# Patient Record
Sex: Female | Born: 1950 | Race: Black or African American | Hispanic: No | Marital: Married | Smoking: Former smoker
Health system: Southern US, Community
[De-identification: ages and names within clinical notes are randomized; demographics above are authoritative.]

## PROBLEM LIST (undated history)

## (undated) DIAGNOSIS — Z9289 Personal history of other medical treatment: Secondary | ICD-10-CM

## (undated) DIAGNOSIS — C189 Malignant neoplasm of colon, unspecified: Secondary | ICD-10-CM

## (undated) DIAGNOSIS — R51 Headache: Secondary | ICD-10-CM

## (undated) DIAGNOSIS — R531 Weakness: Secondary | ICD-10-CM

## (undated) DIAGNOSIS — R519 Headache, unspecified: Secondary | ICD-10-CM

## (undated) DIAGNOSIS — I639 Cerebral infarction, unspecified: Secondary | ICD-10-CM

## (undated) DIAGNOSIS — D649 Anemia, unspecified: Secondary | ICD-10-CM

## (undated) DIAGNOSIS — I10 Essential (primary) hypertension: Secondary | ICD-10-CM

## (undated) DIAGNOSIS — E785 Hyperlipidemia, unspecified: Secondary | ICD-10-CM

## (undated) DIAGNOSIS — E119 Type 2 diabetes mellitus without complications: Secondary | ICD-10-CM

## (undated) DIAGNOSIS — M1990 Unspecified osteoarthritis, unspecified site: Secondary | ICD-10-CM

## (undated) HISTORY — PX: BREAST LUMPECTOMY: SHX2

## (undated) HISTORY — PX: COLECTOMY: SHX59

## (undated) HISTORY — PX: CATARACT EXTRACTION W/ INTRAOCULAR LENS  IMPLANT, BILATERAL: SHX1307

## (undated) HISTORY — PX: ABDOMINAL HYSTERECTOMY: SHX81

---

## 1997-05-21 ENCOUNTER — Other Ambulatory Visit: Admission: RE | Admit: 1997-05-21 | Discharge: 1997-05-21 | Payer: Self-pay

## 1997-05-21 ENCOUNTER — Ambulatory Visit (HOSPITAL_COMMUNITY): Admission: RE | Admit: 1997-05-21 | Discharge: 1997-05-21 | Payer: Self-pay | Admitting: Internal Medicine

## 1997-05-22 ENCOUNTER — Other Ambulatory Visit: Admission: RE | Admit: 1997-05-22 | Discharge: 1997-05-22 | Payer: Self-pay | Admitting: Internal Medicine

## 1997-08-01 ENCOUNTER — Ambulatory Visit: Admission: RE | Admit: 1997-08-01 | Discharge: 1997-08-01 | Payer: Self-pay | Admitting: Internal Medicine

## 1998-11-28 ENCOUNTER — Other Ambulatory Visit: Admission: RE | Admit: 1998-11-28 | Discharge: 1998-11-28 | Payer: Self-pay | Admitting: Internal Medicine

## 1998-12-09 ENCOUNTER — Ambulatory Visit (HOSPITAL_COMMUNITY): Admission: RE | Admit: 1998-12-09 | Discharge: 1998-12-09 | Payer: Self-pay | Admitting: Internal Medicine

## 1998-12-09 ENCOUNTER — Encounter: Payer: Self-pay | Admitting: Internal Medicine

## 1999-11-04 ENCOUNTER — Encounter: Payer: Self-pay | Admitting: *Deleted

## 1999-11-04 ENCOUNTER — Observation Stay (HOSPITAL_COMMUNITY): Admission: EM | Admit: 1999-11-04 | Discharge: 1999-11-05 | Payer: Self-pay | Admitting: Emergency Medicine

## 1999-11-05 ENCOUNTER — Encounter: Payer: Self-pay | Admitting: Internal Medicine

## 2002-10-09 ENCOUNTER — Other Ambulatory Visit: Admission: RE | Admit: 2002-10-09 | Discharge: 2002-10-09 | Payer: Self-pay | Admitting: Internal Medicine

## 2002-10-11 ENCOUNTER — Encounter: Payer: Self-pay | Admitting: Internal Medicine

## 2002-10-11 ENCOUNTER — Encounter: Admission: RE | Admit: 2002-10-11 | Discharge: 2002-10-11 | Payer: Self-pay | Admitting: Internal Medicine

## 2002-10-30 ENCOUNTER — Encounter: Payer: Self-pay | Admitting: Internal Medicine

## 2002-10-30 ENCOUNTER — Encounter: Admission: RE | Admit: 2002-10-30 | Discharge: 2002-10-30 | Payer: Self-pay | Admitting: Internal Medicine

## 2003-02-14 ENCOUNTER — Emergency Department (HOSPITAL_COMMUNITY): Admission: AD | Admit: 2003-02-14 | Discharge: 2003-02-14 | Payer: Self-pay | Admitting: Family Medicine

## 2003-08-10 ENCOUNTER — Ambulatory Visit (HOSPITAL_COMMUNITY): Admission: RE | Admit: 2003-08-10 | Discharge: 2003-08-10 | Payer: Self-pay | Admitting: Gastroenterology

## 2003-08-21 ENCOUNTER — Ambulatory Visit (HOSPITAL_COMMUNITY): Admission: RE | Admit: 2003-08-21 | Discharge: 2003-08-21 | Payer: Self-pay | Admitting: Gastroenterology

## 2005-05-19 IMAGING — CT CT ABDOMEN W/ CM
1 of 4 series · 13 of 32 positions shown, 18 images · IV contrast (omnipaque)
Comparison: none

CLINICAL DATA: Colon ca; anemia
 CT ABDOMEN WITH CONTRAST
 Multidetector helical scans through the abdomen were performed after oral and IV contrast media were given.  150 cc of Omnipaque 300 were given as the contrast media.  
 Mild pleural thickening is noted posteriorly at the right lung base.  There is some inhomogeneity to the enhancement of the dome of the liver, suspicious for metastatic involvement.  As seen on image #12, there is a faint, peripherally enhancing lesion in the anterolateral right lobe of liver with a questionable subtle lesion in the posterior right lobe at that level as well.  If further confirmation is necessary, PET scan of the liver may be warranted.  There is also question of a subtle low attenuation lesion in the very dome of the right lobe of liver on image #7.  No ductal dilatation is seen.  No calcified gallstones are noted.  The pancreas is normal in size and no pancreatic ductal dilatation is seen.  The adrenal glands appear normal as does the spleen.  The kidneys enhance normally and on delayed images, the pelvocaliceal systems appear normal.  There is a rounded low attenuation structure in the posteromedial right mid kidney most consistent with benign process such as parapelvic cyst.  No adenopathy is seen.  The abdominal aorta is normal in caliber.  The appendix appears normal.  
 IMPRESSION
 1.  Inhomogeneity particularly to the dome of the right lobe of liver suspicious for subtle metastatic involvement.  Consider further evaluation as with PET scan of the liver.  
 2.  No adenopathy.  
 3.  Small hernia to the right of midline in the right lower abdomen. 
 CT OF THE PELVIS WITH CONTRAST
 Scans were continued through the pelvis after oral and IV contrast media were given.  The uterus is enlarged with areas of enhancement and diminished attenuation consistent with multiple uterine fibroids.  The urinary bladder is decompressed and unremarkable.  No pelvic adenopathy is seen.  There are significant degenerative changes involving the facet joints, particularly of L5-S1. 
 1.  Enlarged lobular uterus with inhomogeneous enhancement most consistent with multiple uterine fibroids. 
 2.  No pelvic adenopathy.
 3.  Degenerative change in the facet joints of L5-S1.

[Series 3: — · axial · 0.76mm/px · z∈[-479,-84]mm · 13 of 91 slices shown, 18 images]
[im 6/91  soft-tissue]
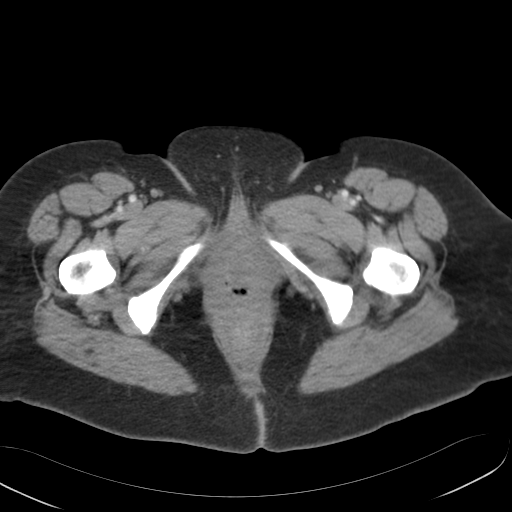
[im 6/91  bone]
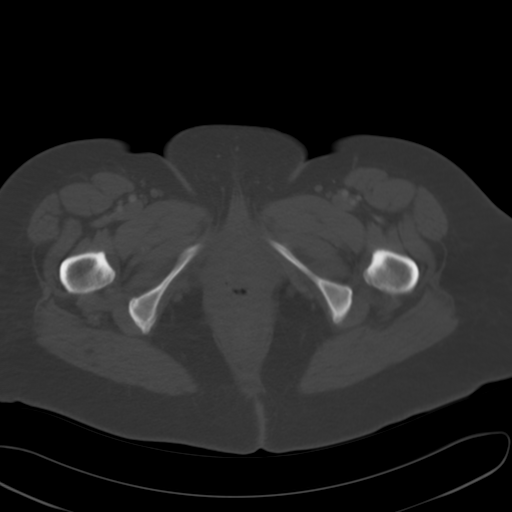
[im 12/91  soft-tissue]
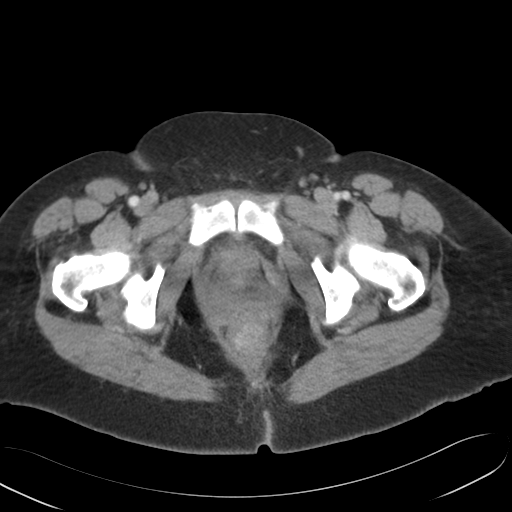
[im 23/91  soft-tissue]
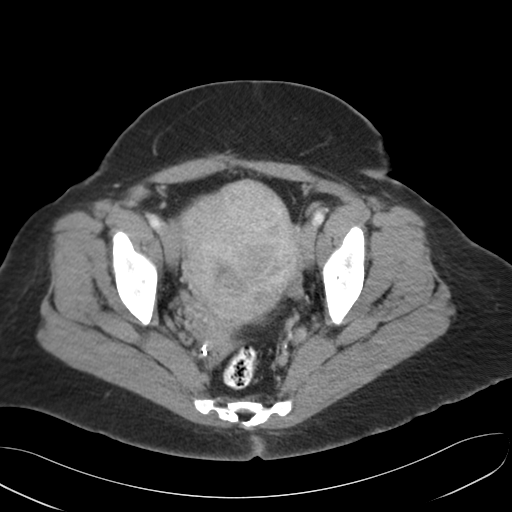
[im 29/91  soft-tissue]
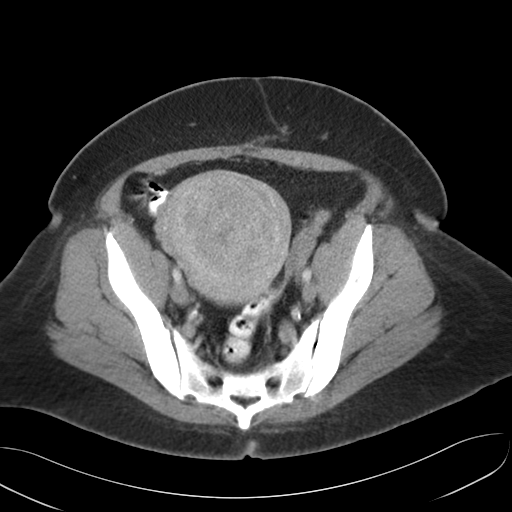
[im 34/91  soft-tissue]
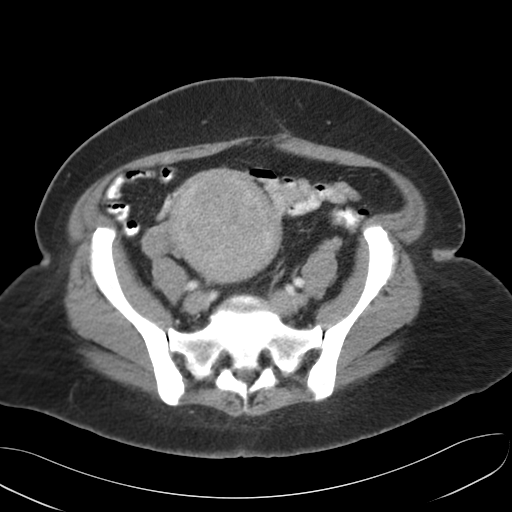
[im 40/91  soft-tissue]
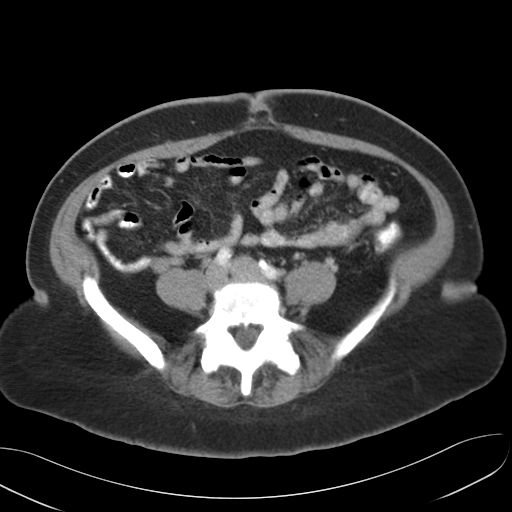
[im 51/91  soft-tissue]
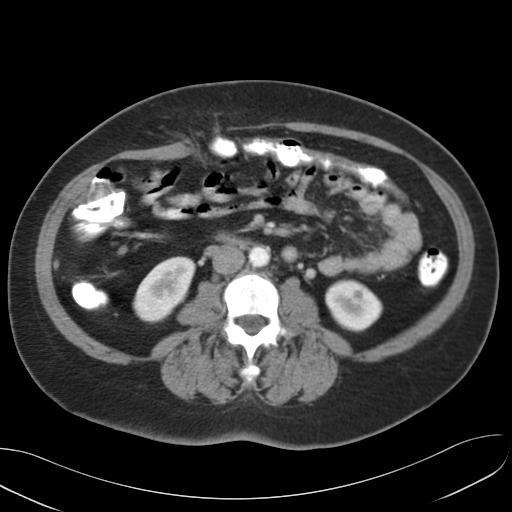
[im 57/91  soft-tissue]
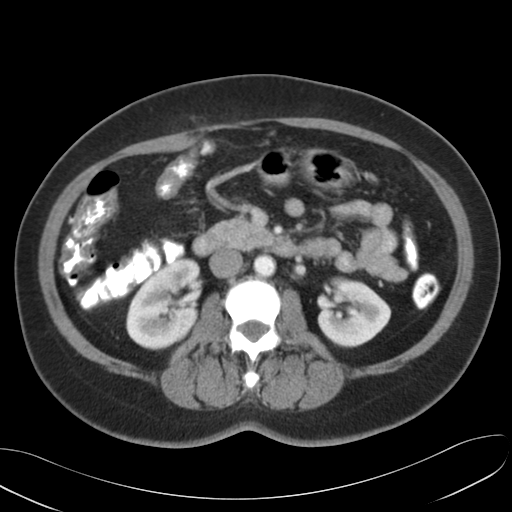
[im 62/91  soft-tissue]
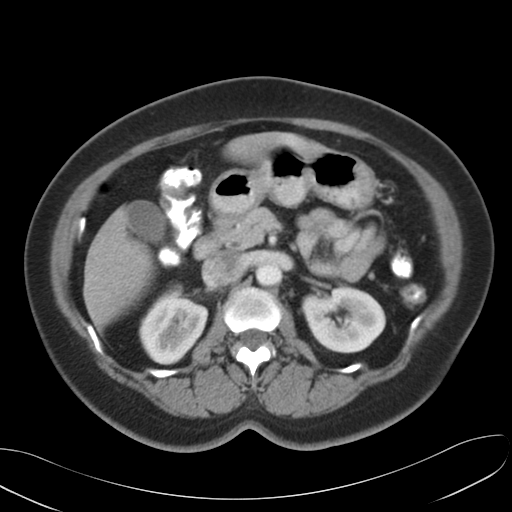
[im 62/91  bone]
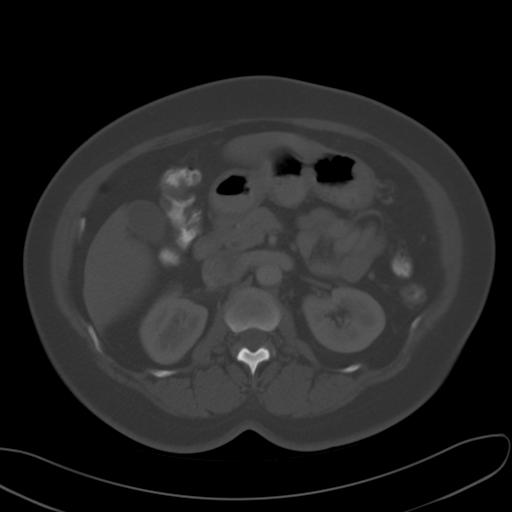
[im 68/91  soft-tissue]
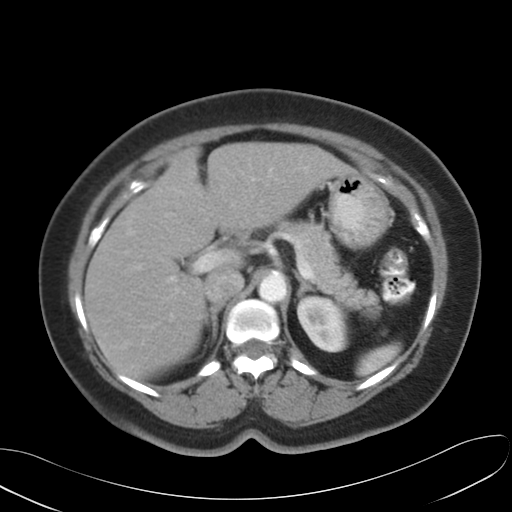
[im 68/91  lung]
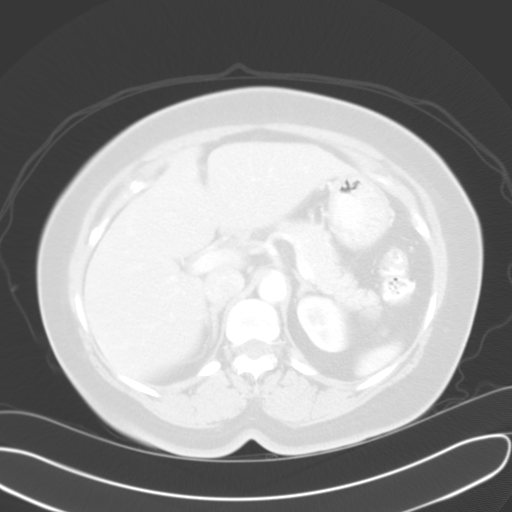
[im 74/91  lung]
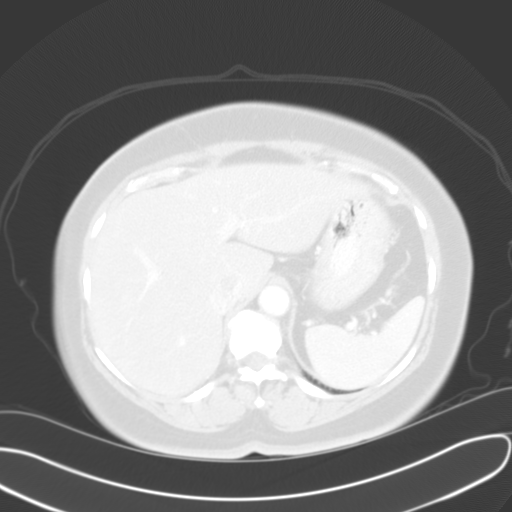
[im 79/91  soft-tissue]
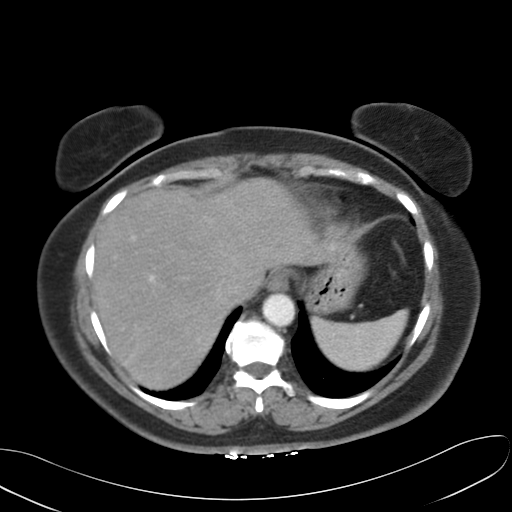
[im 79/91  lung]
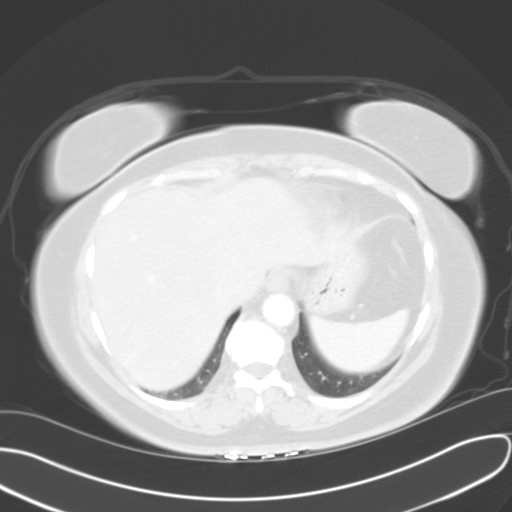
[im 85/91  soft-tissue]
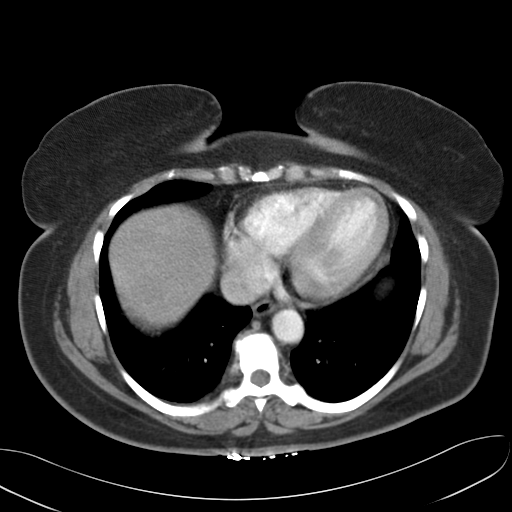
[im 85/91  lung]
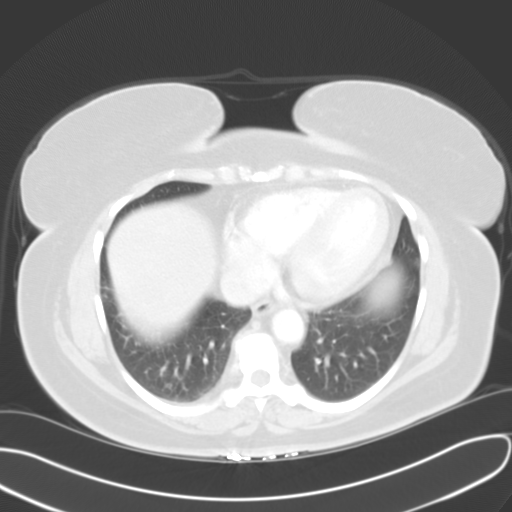

[13 of 32 positions shown; findings below may reference images not displayed]

## 2006-02-15 ENCOUNTER — Encounter: Admission: RE | Admit: 2006-02-15 | Discharge: 2006-02-15 | Payer: Self-pay | Admitting: Internal Medicine

## 2010-07-15 ENCOUNTER — Other Ambulatory Visit (HOSPITAL_COMMUNITY)
Admission: RE | Admit: 2010-07-15 | Discharge: 2010-07-15 | Disposition: A | Discharge status: Home / Self Care | Payer: BC Managed Care – PPO | Source: Ambulatory Visit | Attending: Obstetrics and Gynecology | Admitting: Obstetrics and Gynecology

## 2010-07-15 ENCOUNTER — Other Ambulatory Visit: Payer: Self-pay | Admitting: Obstetrics and Gynecology

## 2010-07-15 DIAGNOSIS — Z01419 Encounter for gynecological examination (general) (routine) without abnormal findings: Secondary | ICD-10-CM | POA: Insufficient documentation

## 2010-09-01 ENCOUNTER — Encounter: Payer: BC Managed Care – PPO | Attending: Internal Medicine

## 2010-09-01 DIAGNOSIS — E119 Type 2 diabetes mellitus without complications: Secondary | ICD-10-CM | POA: Insufficient documentation

## 2010-09-01 DIAGNOSIS — Z713 Dietary counseling and surveillance: Secondary | ICD-10-CM | POA: Insufficient documentation

## 2010-09-01 NOTE — Progress Notes (Signed)
  Patient was seen on 09/01/2010 for the first of a series of three diabetes self-management courses at the [facility]. The following learning objectives were met by the patient during this course:   Defines diabetes and the role of insulin  Identifies type of diabetes and pathophysiology  States normal BG range and personal goals  Identifies three risk factors for the development of diabetes  States the need for and frequency of healthcare follow up (ADA Standards of Care)  Patient has established the following initial goals:  Monitor blood glucose  Follow a diabetes meal plan  No results found for this basename: HGBA1C  MD reported A1c = 14.6  Follow up plan includes: Core Diabetes Classes at [facility]

## 2010-09-14 NOTE — Progress Notes (Signed)
  Patient was seen on 09/09/2010 for the second of a series of three diabetes self-management courses at the [facility]. The following learning objectives were met by the patient during this course:   States the relationship of exercise to blood glucose  States benefits/barriers of regular and safe exercise  States three guidelines for safe and effective exercise  Describes personal diabetes medicine regimen  Describes actions of own medications  Describes causes, symptoms, and treatment of hypo/hyperglycemia  Describes sick day rules  Identifies when to test urine for ketones when appropriate  Identifies when to call healthcare provider for acute complications  States the risk for problems with foot, skin, and dental care  States preventative foot, skin, and dental care measures  States when to call healthcare provider regarding foot, skin, and dental care  Identifies methods for evaluation of diabetes control  Discusses benefits of SBGM  Identifies relationship between nutrition, exercise, medication, and glucose levels  Discusses the importance of record keeping  Follow-Up Plan: Patient will attend the final class of the ADA Diabetes Self-Care Education.  

## 2010-09-21 NOTE — Progress Notes (Signed)
  Patient was seen on 09/16/2010 for the third of a series of three diabetes self-management courses at the [facility]. The following learning objectives were met by the patient during this course:   Identifies nutrient effects on glycemia  States the general guidelines of meal planning  Relates understanding of personal meal plan  Describes situations that cause stress and discuss methods of stress management  Identifies lifestyle behaviors for change  The following handouts were given in class:  [employer] Carbohydrate Counting book  3 Month Follow Up Visit handout  Goal setting handout  Class evaluation form  Your patient has established the following 3 month goals for diabetes self-care:  Walk/bike/swim/aerobic exercise 30 minutes 3 times per week.  Follow-Up Plan: Patient will attend a 3 month follow-up visit for diabetes self-management education.

## 2014-02-23 DIAGNOSIS — E785 Hyperlipidemia, unspecified: Secondary | ICD-10-CM | POA: Diagnosis not present

## 2014-02-23 DIAGNOSIS — M1710 Unilateral primary osteoarthritis, unspecified knee: Secondary | ICD-10-CM | POA: Diagnosis not present

## 2014-02-23 DIAGNOSIS — M545 Low back pain: Secondary | ICD-10-CM | POA: Diagnosis not present

## 2014-02-23 DIAGNOSIS — I10 Essential (primary) hypertension: Secondary | ICD-10-CM | POA: Diagnosis not present

## 2014-02-23 DIAGNOSIS — E119 Type 2 diabetes mellitus without complications: Secondary | ICD-10-CM | POA: Diagnosis not present

## 2014-03-02 DIAGNOSIS — I10 Essential (primary) hypertension: Secondary | ICD-10-CM | POA: Diagnosis not present

## 2014-03-02 DIAGNOSIS — R9431 Abnormal electrocardiogram [ECG] [EKG]: Secondary | ICD-10-CM | POA: Diagnosis not present

## 2014-03-23 DIAGNOSIS — E785 Hyperlipidemia, unspecified: Secondary | ICD-10-CM | POA: Diagnosis not present

## 2014-03-23 DIAGNOSIS — E119 Type 2 diabetes mellitus without complications: Secondary | ICD-10-CM | POA: Diagnosis not present

## 2014-03-23 DIAGNOSIS — I10 Essential (primary) hypertension: Secondary | ICD-10-CM | POA: Diagnosis not present

## 2014-03-23 DIAGNOSIS — M545 Low back pain: Secondary | ICD-10-CM | POA: Diagnosis not present

## 2014-03-23 DIAGNOSIS — M1710 Unilateral primary osteoarthritis, unspecified knee: Secondary | ICD-10-CM | POA: Diagnosis not present

## 2014-04-16 DIAGNOSIS — M1710 Unilateral primary osteoarthritis, unspecified knee: Secondary | ICD-10-CM | POA: Diagnosis not present

## 2014-04-16 DIAGNOSIS — E785 Hyperlipidemia, unspecified: Secondary | ICD-10-CM | POA: Diagnosis not present

## 2014-04-16 DIAGNOSIS — M545 Low back pain: Secondary | ICD-10-CM | POA: Diagnosis not present

## 2014-04-16 DIAGNOSIS — I10 Essential (primary) hypertension: Secondary | ICD-10-CM | POA: Diagnosis not present

## 2014-04-16 DIAGNOSIS — E119 Type 2 diabetes mellitus without complications: Secondary | ICD-10-CM | POA: Diagnosis not present

## 2014-07-05 DIAGNOSIS — M79609 Pain in unspecified limb: Secondary | ICD-10-CM | POA: Diagnosis not present

## 2014-07-05 DIAGNOSIS — Z7689 Persons encountering health services in other specified circumstances: Secondary | ICD-10-CM | POA: Diagnosis not present

## 2014-07-05 DIAGNOSIS — M545 Low back pain: Secondary | ICD-10-CM | POA: Diagnosis not present

## 2014-07-05 DIAGNOSIS — E785 Hyperlipidemia, unspecified: Secondary | ICD-10-CM | POA: Diagnosis not present

## 2014-07-05 DIAGNOSIS — E119 Type 2 diabetes mellitus without complications: Secondary | ICD-10-CM | POA: Diagnosis not present

## 2014-07-05 DIAGNOSIS — I10 Essential (primary) hypertension: Secondary | ICD-10-CM | POA: Diagnosis not present

## 2014-07-23 DIAGNOSIS — E119 Type 2 diabetes mellitus without complications: Secondary | ICD-10-CM | POA: Diagnosis not present

## 2014-07-23 DIAGNOSIS — Z1389 Encounter for screening for other disorder: Secondary | ICD-10-CM | POA: Diagnosis not present

## 2014-07-23 DIAGNOSIS — Z01118 Encounter for examination of ears and hearing with other abnormal findings: Secondary | ICD-10-CM | POA: Diagnosis not present

## 2014-07-23 DIAGNOSIS — Z0100 Encounter for examination of eyes and vision without abnormal findings: Secondary | ICD-10-CM | POA: Diagnosis not present

## 2014-07-23 DIAGNOSIS — Z0000 Encounter for general adult medical examination without abnormal findings: Secondary | ICD-10-CM | POA: Diagnosis not present

## 2014-07-23 DIAGNOSIS — Z7689 Persons encountering health services in other specified circumstances: Secondary | ICD-10-CM | POA: Diagnosis not present

## 2014-07-25 DIAGNOSIS — I10 Essential (primary) hypertension: Secondary | ICD-10-CM | POA: Diagnosis not present

## 2014-07-25 DIAGNOSIS — R071 Chest pain on breathing: Secondary | ICD-10-CM | POA: Diagnosis not present

## 2014-07-25 DIAGNOSIS — Z85038 Personal history of other malignant neoplasm of large intestine: Secondary | ICD-10-CM | POA: Diagnosis not present

## 2014-07-25 DIAGNOSIS — E119 Type 2 diabetes mellitus without complications: Secondary | ICD-10-CM | POA: Diagnosis not present

## 2014-07-25 DIAGNOSIS — Z1211 Encounter for screening for malignant neoplasm of colon: Secondary | ICD-10-CM | POA: Diagnosis not present

## 2014-08-08 DIAGNOSIS — Z1231 Encounter for screening mammogram for malignant neoplasm of breast: Secondary | ICD-10-CM | POA: Diagnosis not present

## 2014-08-13 DIAGNOSIS — I10 Essential (primary) hypertension: Secondary | ICD-10-CM | POA: Diagnosis not present

## 2014-08-13 DIAGNOSIS — M545 Low back pain: Secondary | ICD-10-CM | POA: Diagnosis not present

## 2014-08-13 DIAGNOSIS — M1710 Unilateral primary osteoarthritis, unspecified knee: Secondary | ICD-10-CM | POA: Diagnosis not present

## 2014-08-13 DIAGNOSIS — E119 Type 2 diabetes mellitus without complications: Secondary | ICD-10-CM | POA: Diagnosis not present

## 2014-08-13 DIAGNOSIS — E785 Hyperlipidemia, unspecified: Secondary | ICD-10-CM | POA: Diagnosis not present

## 2014-08-16 DIAGNOSIS — H903 Sensorineural hearing loss, bilateral: Secondary | ICD-10-CM | POA: Diagnosis not present

## 2014-08-29 DIAGNOSIS — E119 Type 2 diabetes mellitus without complications: Secondary | ICD-10-CM | POA: Diagnosis not present

## 2014-08-29 DIAGNOSIS — I10 Essential (primary) hypertension: Secondary | ICD-10-CM | POA: Diagnosis not present

## 2014-08-29 DIAGNOSIS — E780 Pure hypercholesterolemia: Secondary | ICD-10-CM | POA: Diagnosis not present

## 2014-08-29 DIAGNOSIS — R0789 Other chest pain: Secondary | ICD-10-CM | POA: Diagnosis not present

## 2014-09-10 DIAGNOSIS — E119 Type 2 diabetes mellitus without complications: Secondary | ICD-10-CM | POA: Diagnosis not present

## 2014-09-10 DIAGNOSIS — R0789 Other chest pain: Secondary | ICD-10-CM | POA: Diagnosis not present

## 2014-09-10 DIAGNOSIS — I10 Essential (primary) hypertension: Secondary | ICD-10-CM | POA: Diagnosis not present

## 2014-09-12 DIAGNOSIS — E11329 Type 2 diabetes mellitus with mild nonproliferative diabetic retinopathy without macular edema: Secondary | ICD-10-CM | POA: Diagnosis not present

## 2014-09-12 DIAGNOSIS — D3132 Benign neoplasm of left choroid: Secondary | ICD-10-CM | POA: Diagnosis not present

## 2014-09-12 DIAGNOSIS — H35033 Hypertensive retinopathy, bilateral: Secondary | ICD-10-CM | POA: Diagnosis not present

## 2014-09-21 DIAGNOSIS — M545 Low back pain: Secondary | ICD-10-CM | POA: Diagnosis not present

## 2014-09-21 DIAGNOSIS — Z0000 Encounter for general adult medical examination without abnormal findings: Secondary | ICD-10-CM | POA: Diagnosis not present

## 2014-09-21 DIAGNOSIS — I10 Essential (primary) hypertension: Secondary | ICD-10-CM | POA: Diagnosis not present

## 2014-09-21 DIAGNOSIS — E119 Type 2 diabetes mellitus without complications: Secondary | ICD-10-CM | POA: Diagnosis not present

## 2014-09-21 DIAGNOSIS — E785 Hyperlipidemia, unspecified: Secondary | ICD-10-CM | POA: Diagnosis not present

## 2014-11-06 DIAGNOSIS — Z85038 Personal history of other malignant neoplasm of large intestine: Secondary | ICD-10-CM | POA: Diagnosis not present

## 2014-11-06 DIAGNOSIS — E119 Type 2 diabetes mellitus without complications: Secondary | ICD-10-CM | POA: Diagnosis not present

## 2014-11-08 DIAGNOSIS — D128 Benign neoplasm of rectum: Secondary | ICD-10-CM | POA: Diagnosis not present

## 2014-11-08 DIAGNOSIS — K621 Rectal polyp: Secondary | ICD-10-CM | POA: Diagnosis not present

## 2014-11-08 DIAGNOSIS — Z9049 Acquired absence of other specified parts of digestive tract: Secondary | ICD-10-CM | POA: Diagnosis not present

## 2014-11-08 DIAGNOSIS — Z1211 Encounter for screening for malignant neoplasm of colon: Secondary | ICD-10-CM | POA: Diagnosis not present

## 2014-11-08 DIAGNOSIS — Z85038 Personal history of other malignant neoplasm of large intestine: Secondary | ICD-10-CM | POA: Diagnosis not present

## 2014-11-08 DIAGNOSIS — K439 Ventral hernia without obstruction or gangrene: Secondary | ICD-10-CM | POA: Diagnosis not present

## 2014-11-13 DIAGNOSIS — C189 Malignant neoplasm of colon, unspecified: Secondary | ICD-10-CM | POA: Diagnosis not present

## 2014-11-13 DIAGNOSIS — Z85038 Personal history of other malignant neoplasm of large intestine: Secondary | ICD-10-CM | POA: Diagnosis not present

## 2014-11-13 DIAGNOSIS — Z01818 Encounter for other preprocedural examination: Secondary | ICD-10-CM | POA: Diagnosis not present

## 2014-11-19 DIAGNOSIS — Z9889 Other specified postprocedural states: Secondary | ICD-10-CM | POA: Diagnosis not present

## 2014-11-19 DIAGNOSIS — Z86010 Personal history of colonic polyps: Secondary | ICD-10-CM | POA: Diagnosis not present

## 2014-11-19 DIAGNOSIS — K5730 Diverticulosis of large intestine without perforation or abscess without bleeding: Secondary | ICD-10-CM | POA: Diagnosis not present

## 2014-11-19 DIAGNOSIS — Z85038 Personal history of other malignant neoplasm of large intestine: Secondary | ICD-10-CM | POA: Diagnosis not present

## 2014-11-22 DIAGNOSIS — H35033 Hypertensive retinopathy, bilateral: Secondary | ICD-10-CM | POA: Diagnosis not present

## 2014-11-22 DIAGNOSIS — D3132 Benign neoplasm of left choroid: Secondary | ICD-10-CM | POA: Diagnosis not present

## 2014-11-22 DIAGNOSIS — E113291 Type 2 diabetes mellitus with mild nonproliferative diabetic retinopathy without macular edema, right eye: Secondary | ICD-10-CM | POA: Diagnosis not present

## 2014-11-22 DIAGNOSIS — E119 Type 2 diabetes mellitus without complications: Secondary | ICD-10-CM | POA: Diagnosis not present

## 2015-03-18 DIAGNOSIS — I10 Essential (primary) hypertension: Secondary | ICD-10-CM | POA: Diagnosis not present

## 2015-03-18 DIAGNOSIS — Z0000 Encounter for general adult medical examination without abnormal findings: Secondary | ICD-10-CM | POA: Diagnosis not present

## 2015-03-18 DIAGNOSIS — E119 Type 2 diabetes mellitus without complications: Secondary | ICD-10-CM | POA: Diagnosis not present

## 2015-03-18 DIAGNOSIS — M1710 Unilateral primary osteoarthritis, unspecified knee: Secondary | ICD-10-CM | POA: Diagnosis not present

## 2015-03-18 DIAGNOSIS — E785 Hyperlipidemia, unspecified: Secondary | ICD-10-CM | POA: Diagnosis not present

## 2015-03-18 DIAGNOSIS — Z7689 Persons encountering health services in other specified circumstances: Secondary | ICD-10-CM | POA: Diagnosis not present

## 2015-03-25 DIAGNOSIS — E119 Type 2 diabetes mellitus without complications: Secondary | ICD-10-CM | POA: Diagnosis not present

## 2015-03-25 DIAGNOSIS — I10 Essential (primary) hypertension: Secondary | ICD-10-CM | POA: Diagnosis not present

## 2015-04-01 DIAGNOSIS — I10 Essential (primary) hypertension: Secondary | ICD-10-CM | POA: Diagnosis not present

## 2015-04-01 DIAGNOSIS — E119 Type 2 diabetes mellitus without complications: Secondary | ICD-10-CM | POA: Diagnosis not present

## 2015-04-29 DIAGNOSIS — I10 Essential (primary) hypertension: Secondary | ICD-10-CM | POA: Diagnosis not present

## 2015-04-29 DIAGNOSIS — E119 Type 2 diabetes mellitus without complications: Secondary | ICD-10-CM | POA: Diagnosis not present

## 2015-04-29 DIAGNOSIS — E785 Hyperlipidemia, unspecified: Secondary | ICD-10-CM | POA: Diagnosis not present

## 2015-04-29 DIAGNOSIS — M1710 Unilateral primary osteoarthritis, unspecified knee: Secondary | ICD-10-CM | POA: Diagnosis not present

## 2015-05-09 DIAGNOSIS — E119 Type 2 diabetes mellitus without complications: Secondary | ICD-10-CM | POA: Diagnosis not present

## 2015-05-09 DIAGNOSIS — I10 Essential (primary) hypertension: Secondary | ICD-10-CM | POA: Diagnosis not present

## 2015-05-30 DIAGNOSIS — I10 Essential (primary) hypertension: Secondary | ICD-10-CM | POA: Diagnosis not present

## 2015-05-30 DIAGNOSIS — E119 Type 2 diabetes mellitus without complications: Secondary | ICD-10-CM | POA: Diagnosis not present

## 2015-07-02 DIAGNOSIS — I10 Essential (primary) hypertension: Secondary | ICD-10-CM | POA: Diagnosis not present

## 2015-07-02 DIAGNOSIS — E119 Type 2 diabetes mellitus without complications: Secondary | ICD-10-CM | POA: Diagnosis not present

## 2015-07-22 DIAGNOSIS — Z01118 Encounter for examination of ears and hearing with other abnormal findings: Secondary | ICD-10-CM | POA: Diagnosis not present

## 2015-07-22 DIAGNOSIS — Z0000 Encounter for general adult medical examination without abnormal findings: Secondary | ICD-10-CM | POA: Diagnosis not present

## 2015-07-22 DIAGNOSIS — I10 Essential (primary) hypertension: Secondary | ICD-10-CM | POA: Diagnosis not present

## 2015-07-22 DIAGNOSIS — Z7689 Persons encountering health services in other specified circumstances: Secondary | ICD-10-CM | POA: Diagnosis not present

## 2015-07-22 DIAGNOSIS — Z136 Encounter for screening for cardiovascular disorders: Secondary | ICD-10-CM | POA: Diagnosis not present

## 2015-07-22 DIAGNOSIS — E119 Type 2 diabetes mellitus without complications: Secondary | ICD-10-CM | POA: Diagnosis not present

## 2015-08-05 DIAGNOSIS — I10 Essential (primary) hypertension: Secondary | ICD-10-CM | POA: Diagnosis not present

## 2015-08-05 DIAGNOSIS — E119 Type 2 diabetes mellitus without complications: Secondary | ICD-10-CM | POA: Diagnosis not present

## 2015-08-05 DIAGNOSIS — E785 Hyperlipidemia, unspecified: Secondary | ICD-10-CM | POA: Diagnosis not present

## 2015-08-05 DIAGNOSIS — M1710 Unilateral primary osteoarthritis, unspecified knee: Secondary | ICD-10-CM | POA: Diagnosis not present

## 2015-08-21 DIAGNOSIS — M79609 Pain in unspecified limb: Secondary | ICD-10-CM | POA: Diagnosis not present

## 2015-08-21 DIAGNOSIS — R0989 Other specified symptoms and signs involving the circulatory and respiratory systems: Secondary | ICD-10-CM | POA: Diagnosis not present

## 2015-08-21 DIAGNOSIS — E119 Type 2 diabetes mellitus without complications: Secondary | ICD-10-CM | POA: Diagnosis not present

## 2015-08-21 DIAGNOSIS — I10 Essential (primary) hypertension: Secondary | ICD-10-CM | POA: Diagnosis not present

## 2015-09-03 DIAGNOSIS — Z1231 Encounter for screening mammogram for malignant neoplasm of breast: Secondary | ICD-10-CM | POA: Diagnosis not present

## 2015-09-04 DIAGNOSIS — E119 Type 2 diabetes mellitus without complications: Secondary | ICD-10-CM | POA: Diagnosis not present

## 2015-09-04 DIAGNOSIS — I10 Essential (primary) hypertension: Secondary | ICD-10-CM | POA: Diagnosis not present

## 2015-09-09 DIAGNOSIS — E785 Hyperlipidemia, unspecified: Secondary | ICD-10-CM | POA: Diagnosis not present

## 2015-09-09 DIAGNOSIS — E119 Type 2 diabetes mellitus without complications: Secondary | ICD-10-CM | POA: Diagnosis not present

## 2015-09-09 DIAGNOSIS — I10 Essential (primary) hypertension: Secondary | ICD-10-CM | POA: Diagnosis not present

## 2015-09-09 DIAGNOSIS — M1710 Unilateral primary osteoarthritis, unspecified knee: Secondary | ICD-10-CM | POA: Diagnosis not present

## 2015-09-11 DIAGNOSIS — H26493 Other secondary cataract, bilateral: Secondary | ICD-10-CM | POA: Diagnosis not present

## 2015-09-11 DIAGNOSIS — H35033 Hypertensive retinopathy, bilateral: Secondary | ICD-10-CM | POA: Diagnosis not present

## 2015-09-11 DIAGNOSIS — D3132 Benign neoplasm of left choroid: Secondary | ICD-10-CM | POA: Diagnosis not present

## 2015-09-11 DIAGNOSIS — E119 Type 2 diabetes mellitus without complications: Secondary | ICD-10-CM | POA: Diagnosis not present

## 2015-10-25 DIAGNOSIS — E119 Type 2 diabetes mellitus without complications: Secondary | ICD-10-CM | POA: Diagnosis not present

## 2015-10-25 DIAGNOSIS — I10 Essential (primary) hypertension: Secondary | ICD-10-CM | POA: Diagnosis not present

## 2015-10-28 DIAGNOSIS — I10 Essential (primary) hypertension: Secondary | ICD-10-CM | POA: Diagnosis not present

## 2015-10-28 DIAGNOSIS — E785 Hyperlipidemia, unspecified: Secondary | ICD-10-CM | POA: Diagnosis not present

## 2015-10-28 DIAGNOSIS — E119 Type 2 diabetes mellitus without complications: Secondary | ICD-10-CM | POA: Diagnosis not present

## 2015-10-28 DIAGNOSIS — M1710 Unilateral primary osteoarthritis, unspecified knee: Secondary | ICD-10-CM | POA: Diagnosis not present

## 2015-11-22 DIAGNOSIS — I10 Essential (primary) hypertension: Secondary | ICD-10-CM | POA: Diagnosis not present

## 2015-11-22 DIAGNOSIS — E119 Type 2 diabetes mellitus without complications: Secondary | ICD-10-CM | POA: Diagnosis not present

## 2015-12-02 DIAGNOSIS — Z85038 Personal history of other malignant neoplasm of large intestine: Secondary | ICD-10-CM | POA: Diagnosis not present

## 2015-12-02 DIAGNOSIS — R194 Change in bowel habit: Secondary | ICD-10-CM | POA: Diagnosis not present

## 2015-12-20 DIAGNOSIS — E119 Type 2 diabetes mellitus without complications: Secondary | ICD-10-CM | POA: Diagnosis not present

## 2015-12-20 DIAGNOSIS — I10 Essential (primary) hypertension: Secondary | ICD-10-CM | POA: Diagnosis not present

## 2016-01-03 DIAGNOSIS — Z85038 Personal history of other malignant neoplasm of large intestine: Secondary | ICD-10-CM | POA: Diagnosis not present

## 2016-01-03 DIAGNOSIS — K439 Ventral hernia without obstruction or gangrene: Secondary | ICD-10-CM | POA: Diagnosis not present

## 2016-01-23 DIAGNOSIS — E119 Type 2 diabetes mellitus without complications: Secondary | ICD-10-CM | POA: Diagnosis not present

## 2016-01-23 DIAGNOSIS — I10 Essential (primary) hypertension: Secondary | ICD-10-CM | POA: Diagnosis not present

## 2016-02-10 DIAGNOSIS — I10 Essential (primary) hypertension: Secondary | ICD-10-CM | POA: Diagnosis not present

## 2016-02-10 DIAGNOSIS — E785 Hyperlipidemia, unspecified: Secondary | ICD-10-CM | POA: Diagnosis not present

## 2016-02-10 DIAGNOSIS — Z0000 Encounter for general adult medical examination without abnormal findings: Secondary | ICD-10-CM | POA: Diagnosis not present

## 2016-02-10 DIAGNOSIS — E119 Type 2 diabetes mellitus without complications: Secondary | ICD-10-CM | POA: Diagnosis not present

## 2016-02-19 DIAGNOSIS — K439 Ventral hernia without obstruction or gangrene: Secondary | ICD-10-CM | POA: Diagnosis not present

## 2016-02-19 DIAGNOSIS — K432 Incisional hernia without obstruction or gangrene: Secondary | ICD-10-CM | POA: Diagnosis not present

## 2016-03-02 DIAGNOSIS — K439 Ventral hernia without obstruction or gangrene: Secondary | ICD-10-CM | POA: Diagnosis not present

## 2016-03-02 DIAGNOSIS — C189 Malignant neoplasm of colon, unspecified: Secondary | ICD-10-CM | POA: Diagnosis not present

## 2016-03-02 DIAGNOSIS — K760 Fatty (change of) liver, not elsewhere classified: Secondary | ICD-10-CM | POA: Diagnosis not present

## 2016-03-02 DIAGNOSIS — K432 Incisional hernia without obstruction or gangrene: Secondary | ICD-10-CM | POA: Diagnosis not present

## 2016-03-18 DIAGNOSIS — K432 Incisional hernia without obstruction or gangrene: Secondary | ICD-10-CM | POA: Diagnosis not present

## 2016-03-18 DIAGNOSIS — K439 Ventral hernia without obstruction or gangrene: Secondary | ICD-10-CM | POA: Diagnosis not present

## 2016-07-07 DIAGNOSIS — M1710 Unilateral primary osteoarthritis, unspecified knee: Secondary | ICD-10-CM | POA: Diagnosis not present

## 2016-07-07 DIAGNOSIS — I10 Essential (primary) hypertension: Secondary | ICD-10-CM | POA: Diagnosis not present

## 2016-07-07 DIAGNOSIS — E119 Type 2 diabetes mellitus without complications: Secondary | ICD-10-CM | POA: Diagnosis not present

## 2016-07-07 DIAGNOSIS — Z7689 Persons encountering health services in other specified circumstances: Secondary | ICD-10-CM | POA: Diagnosis not present

## 2016-07-07 DIAGNOSIS — E785 Hyperlipidemia, unspecified: Secondary | ICD-10-CM | POA: Diagnosis not present

## 2016-07-21 DIAGNOSIS — E119 Type 2 diabetes mellitus without complications: Secondary | ICD-10-CM | POA: Diagnosis not present

## 2016-07-21 DIAGNOSIS — Z0000 Encounter for general adult medical examination without abnormal findings: Secondary | ICD-10-CM | POA: Diagnosis not present

## 2016-07-21 DIAGNOSIS — E559 Vitamin D deficiency, unspecified: Secondary | ICD-10-CM | POA: Diagnosis not present

## 2016-07-21 DIAGNOSIS — I10 Essential (primary) hypertension: Secondary | ICD-10-CM | POA: Diagnosis not present

## 2016-08-24 ENCOUNTER — Observation Stay (HOSPITAL_COMMUNITY)
Admission: EM | Admit: 2016-08-24 | Discharge: 2016-08-25 | Disposition: A | Discharge status: Home / Self Care | Payer: Medicare Other | Attending: Internal Medicine | Admitting: Internal Medicine

## 2016-08-24 ENCOUNTER — Encounter (HOSPITAL_COMMUNITY): Payer: Self-pay

## 2016-08-24 ENCOUNTER — Emergency Department (HOSPITAL_COMMUNITY): Payer: Medicare Other

## 2016-08-24 ENCOUNTER — Observation Stay (HOSPITAL_COMMUNITY): Payer: Medicare Other

## 2016-08-24 DIAGNOSIS — D509 Iron deficiency anemia, unspecified: Secondary | ICD-10-CM | POA: Diagnosis not present

## 2016-08-24 DIAGNOSIS — I639 Cerebral infarction, unspecified: Secondary | ICD-10-CM | POA: Diagnosis not present

## 2016-08-24 DIAGNOSIS — E119 Type 2 diabetes mellitus without complications: Secondary | ICD-10-CM

## 2016-08-24 DIAGNOSIS — I6312 Cerebral infarction due to embolism of basilar artery: Secondary | ICD-10-CM

## 2016-08-24 DIAGNOSIS — I6340 Cerebral infarction due to embolism of unspecified cerebral artery: Secondary | ICD-10-CM | POA: Diagnosis not present

## 2016-08-24 DIAGNOSIS — R29705 NIHSS score 5: Secondary | ICD-10-CM | POA: Insufficient documentation

## 2016-08-24 DIAGNOSIS — E784 Other hyperlipidemia: Secondary | ICD-10-CM | POA: Diagnosis not present

## 2016-08-24 DIAGNOSIS — I10 Essential (primary) hypertension: Secondary | ICD-10-CM | POA: Diagnosis not present

## 2016-08-24 DIAGNOSIS — Z7982 Long term (current) use of aspirin: Secondary | ICD-10-CM | POA: Insufficient documentation

## 2016-08-24 DIAGNOSIS — E7849 Other hyperlipidemia: Secondary | ICD-10-CM

## 2016-08-24 DIAGNOSIS — R531 Weakness: Secondary | ICD-10-CM

## 2016-08-24 DIAGNOSIS — R29818 Other symptoms and signs involving the nervous system: Secondary | ICD-10-CM | POA: Diagnosis not present

## 2016-08-24 DIAGNOSIS — Z79899 Other long term (current) drug therapy: Secondary | ICD-10-CM | POA: Insufficient documentation

## 2016-08-24 DIAGNOSIS — D649 Anemia, unspecified: Secondary | ICD-10-CM | POA: Diagnosis not present

## 2016-08-24 DIAGNOSIS — Z7902 Long term (current) use of antithrombotics/antiplatelets: Secondary | ICD-10-CM | POA: Insufficient documentation

## 2016-08-24 DIAGNOSIS — I6501 Occlusion and stenosis of right vertebral artery: Secondary | ICD-10-CM | POA: Diagnosis not present

## 2016-08-24 DIAGNOSIS — Z87891 Personal history of nicotine dependence: Secondary | ICD-10-CM | POA: Insufficient documentation

## 2016-08-24 DIAGNOSIS — Z7984 Long term (current) use of oral hypoglycemic drugs: Secondary | ICD-10-CM | POA: Diagnosis not present

## 2016-08-24 DIAGNOSIS — E785 Hyperlipidemia, unspecified: Secondary | ICD-10-CM | POA: Diagnosis present

## 2016-08-24 DIAGNOSIS — E118 Type 2 diabetes mellitus with unspecified complications: Secondary | ICD-10-CM | POA: Diagnosis not present

## 2016-08-24 DIAGNOSIS — I613 Nontraumatic intracerebral hemorrhage in brain stem: Secondary | ICD-10-CM | POA: Diagnosis not present

## 2016-08-24 HISTORY — DX: Weakness: R53.1

## 2016-08-24 HISTORY — DX: Type 2 diabetes mellitus without complications: E11.9

## 2016-08-24 HISTORY — DX: Essential (primary) hypertension: I10

## 2016-08-24 HISTORY — DX: Malignant neoplasm of colon, unspecified: C18.9

## 2016-08-24 HISTORY — DX: Personal history of other medical treatment: Z92.89

## 2016-08-24 HISTORY — DX: Headache, unspecified: R51.9

## 2016-08-24 HISTORY — DX: Cerebral infarction, unspecified: I63.9

## 2016-08-24 HISTORY — DX: Headache: R51

## 2016-08-24 HISTORY — DX: Unspecified osteoarthritis, unspecified site: M19.90

## 2016-08-24 HISTORY — DX: Anemia, unspecified: D64.9

## 2016-08-24 HISTORY — DX: Hyperlipidemia, unspecified: E78.5

## 2016-08-24 LAB — DIFFERENTIAL
Basophils Absolute: 0 10*3/uL (ref 0.0–0.1)
Basophils Relative: 0 %
EOS ABS: 0.1 10*3/uL (ref 0.0–0.7)
EOS PCT: 1 %
LYMPHS ABS: 2.3 10*3/uL (ref 0.7–4.0)
Lymphocytes Relative: 37 %
MONOS PCT: 5 %
Monocytes Absolute: 0.3 10*3/uL (ref 0.1–1.0)
NEUTROS PCT: 57 %
Neutro Abs: 3.5 10*3/uL (ref 1.7–7.7)

## 2016-08-24 LAB — COMPREHENSIVE METABOLIC PANEL
ALBUMIN: 4.2 g/dL (ref 3.5–5.0)
ALT: 18 U/L (ref 14–54)
ANION GAP: 7 (ref 5–15)
AST: 24 U/L (ref 15–41)
Alkaline Phosphatase: 64 U/L (ref 38–126)
BILIRUBIN TOTAL: 0.4 mg/dL (ref 0.3–1.2)
BUN: 13 mg/dL (ref 6–20)
CO2: 27 mmol/L (ref 22–32)
Calcium: 9.8 mg/dL (ref 8.9–10.3)
Chloride: 106 mmol/L (ref 101–111)
Creatinine, Ser: 0.76 mg/dL (ref 0.44–1.00)
GFR calc Af Amer: >60 mL/min (ref >60)
Glucose, Bld: 218 mg/dL — ABNORMAL HIGH (ref 65–99)
POTASSIUM: 3.9 mmol/L (ref 3.5–5.1)
Sodium: 140 mmol/L (ref 135–145)
TOTAL PROTEIN: 7.1 g/dL (ref 6.5–8.1)

## 2016-08-24 LAB — I-STAT CHEM 8, ED
BUN: 16 mg/dL (ref 6–20)
CREATININE: 0.6 mg/dL (ref 0.44–1.00)
Calcium, Ion: 1.23 mmol/L (ref 1.15–1.40)
Chloride: 104 mmol/L (ref 101–111)
GLUCOSE: 216 mg/dL — AB (ref 65–99)
HCT: 37 % (ref 36.0–46.0)
HEMOGLOBIN: 12.6 g/dL (ref 12.0–15.0)
Potassium: 3.9 mmol/L (ref 3.5–5.1)
Sodium: 142 mmol/L (ref 135–145)
TCO2: 26 mmol/L (ref 0–100)

## 2016-08-24 LAB — URINALYSIS, COMPLETE (UACMP) WITH MICROSCOPIC
BILIRUBIN URINE: NEGATIVE
GLUCOSE, UA: NEGATIVE mg/dL
HGB URINE DIPSTICK: NEGATIVE
KETONES UR: NEGATIVE mg/dL
Nitrite: NEGATIVE
PROTEIN: NEGATIVE mg/dL
Specific Gravity, Urine: 1.023 (ref 1.005–1.030)
pH: 6 (ref 5.0–8.0)

## 2016-08-24 LAB — CBG MONITORING, ED: GLUCOSE-CAPILLARY: 192 mg/dL — AB (ref 65–99)

## 2016-08-24 LAB — RETICULOCYTES
RBC.: 3.49 MIL/uL — ABNORMAL LOW (ref 3.87–5.11)
RETIC COUNT ABSOLUTE: 62.8 10*3/uL (ref 19.0–186.0)
Retic Ct Pct: 1.8 % (ref 0.4–3.1)

## 2016-08-24 LAB — LIPID PANEL
Cholesterol: 190 mg/dL (ref 0–200)
HDL: 67 mg/dL (ref >40)
LDL CALC: 99 mg/dL (ref 0–99)
TRIGLYCERIDES: 121 mg/dL (ref <150)
Total CHOL/HDL Ratio: 2.8 RATIO
VLDL: 24 mg/dL (ref 0–40)

## 2016-08-24 LAB — CBC
HCT: 34.7 % — ABNORMAL LOW (ref 36.0–46.0)
HEMOGLOBIN: 10.9 g/dL — AB (ref 12.0–15.0)
MCH: 29.1 pg (ref 26.0–34.0)
MCHC: 31.4 g/dL (ref 30.0–36.0)
MCV: 92.5 fL (ref 78.0–100.0)
Platelets: 278 10*3/uL (ref 150–400)
RBC: 3.75 MIL/uL — ABNORMAL LOW (ref 3.87–5.11)
RDW: 13.7 % (ref 11.5–15.5)
WBC: 6.1 10*3/uL (ref 4.0–10.5)

## 2016-08-24 LAB — GLUCOSE, CAPILLARY
GLUCOSE-CAPILLARY: 170 mg/dL — AB (ref 65–99)
GLUCOSE-CAPILLARY: 180 mg/dL — AB (ref 65–99)

## 2016-08-24 LAB — IRON AND TIBC
IRON: 77 ug/dL (ref 28–170)
Saturation Ratios: 21 % (ref 10.4–31.8)
TIBC: 367 ug/dL (ref 250–450)
UIBC: 290 ug/dL

## 2016-08-24 LAB — PROTIME-INR
INR: 0.99
PROTHROMBIN TIME: 13.1 s (ref 11.4–15.2)

## 2016-08-24 LAB — VITAMIN B12: VITAMIN B 12: 287 pg/mL (ref 180–914)

## 2016-08-24 LAB — APTT: aPTT: 30 seconds (ref 24–36)

## 2016-08-24 LAB — I-STAT TROPONIN, ED: TROPONIN I, POC: 0 ng/mL (ref 0.00–0.08)

## 2016-08-24 LAB — FERRITIN: Ferritin: 31 ng/mL (ref 11–307)

## 2016-08-24 LAB — FOLATE: FOLATE: 16.3 ng/mL (ref >5.9)

## 2016-08-24 MED ORDER — ACETAMINOPHEN 650 MG RE SUPP: 650.0000 mg | RECTAL | Status: DC | PRN

## 2016-08-24 MED ORDER — ACETAMINOPHEN 160 MG/5ML PO SOLN: 650.0000 mg | ORAL | Status: DC | PRN

## 2016-08-24 MED ORDER — IOPAMIDOL (ISOVUE-370) INJECTION 76%
INTRAVENOUS | Status: AC
  Administered 2016-08-24: 50 mL
  Filled 2016-08-24: qty 50

## 2016-08-24 MED ORDER — ASPIRIN 300 MG RE SUPP: 300.0000 mg | Freq: Every day | RECTAL | Status: DC

## 2016-08-24 MED ORDER — SENNOSIDES-DOCUSATE SODIUM 8.6-50 MG PO TABS: 1.0000 | ORAL_TABLET | Freq: Every evening | ORAL | Status: DC | PRN

## 2016-08-24 MED ORDER — ENOXAPARIN SODIUM 40 MG/0.4ML ~~LOC~~ SOLN
40.0000 mg | SUBCUTANEOUS | Status: DC
  Administered 2016-08-24 – 2016-08-25 (×2): 40 mg via SUBCUTANEOUS
  Filled 2016-08-24 (×2): qty 0.4

## 2016-08-24 MED ORDER — ACETAMINOPHEN 325 MG PO TABS
650.0000 mg | ORAL_TABLET | ORAL | Status: DC | PRN
  Filled 2016-08-24: qty 2

## 2016-08-24 MED ORDER — ASPIRIN 325 MG PO TABS
325.0000 mg | ORAL_TABLET | Freq: Every day | ORAL | Status: DC
  Administered 2016-08-25: 325 mg via ORAL
  Filled 2016-08-24: qty 1

## 2016-08-24 MED ORDER — INSULIN ASPART 100 UNIT/ML ~~LOC~~ SOLN
0.0000 [IU] | Freq: Three times a day (TID) | SUBCUTANEOUS | Status: DC
  Administered 2016-08-24: 2 [IU] via SUBCUTANEOUS
  Administered 2016-08-25: 3 [IU] via SUBCUTANEOUS
  Administered 2016-08-25 (×2): 2 [IU] via SUBCUTANEOUS

## 2016-08-24 MED ORDER — ASPIRIN 81 MG PO CHEW
324.0000 mg | CHEWABLE_TABLET | Freq: Once | ORAL | Status: AC
  Administered 2016-08-24: 324 mg via ORAL
  Filled 2016-08-24: qty 4

## 2016-08-24 MED ORDER — INSULIN ASPART 100 UNIT/ML ~~LOC~~ SOLN: 0.0000 [IU] | Freq: Every day | SUBCUTANEOUS | Status: DC

## 2016-08-24 MED ORDER — SODIUM CHLORIDE 0.9 % IV SOLN: INTRAVENOUS | Status: AC

## 2016-08-24 MED ORDER — GABAPENTIN 100 MG PO CAPS
200.0000 mg | ORAL_CAPSULE | Freq: Every day | ORAL | Status: DC
  Administered 2016-08-24: 200 mg via ORAL
  Filled 2016-08-24: qty 2

## 2016-08-24 MED ORDER — STROKE: EARLY STAGES OF RECOVERY BOOK
Freq: Once | Status: AC
  Administered 2016-08-24: 14:00:00
  Filled 2016-08-24: qty 1

## 2016-08-24 MED ORDER — SODIUM CHLORIDE 0.9 % IV BOLUS (SEPSIS)
500.0000 mL | Freq: Once | INTRAVENOUS | Status: AC
  Administered 2016-08-24: 500 mL via INTRAVENOUS

## 2016-08-24 NOTE — Code Documentation (Signed)
66yo female arriving to [facility] via private vehicle at 65.  Patient from home where she noticed left sided weakness and chest pain at 0300.  Code stroke called on patient arrival.  Patient to CT.  Stroke team to the bedside.  NIHSS 4, see documentation for details and code stroke times.  Patient with left arm and leg weakness on exam.  CT completed.  Dr. Lorraine Lax to the bedside.  CTA head and neck completed.  Patient is outside the window for treatment with tPA.  No acute stroke treatment at this time.  Patient to be admitted for stroke work-up.  Bedside handoff with ED RNs Vicente Males and Lennette Bihari.

## 2016-08-24 NOTE — ED Triage Notes (Signed)
Pt in from home with c/o L sided weakness and CP early this morning. Pt states she could not sleep, began feeling weak to the L side at 0300 and then central cp started with L arm pain radiation. Pt arrives a&ox4, speech clear, airway intact. En route to CT on arrival

## 2016-08-24 NOTE — Progress Notes (Signed)
Patient is aware a urine specimen is needed and a collection container has been placed in the toilet. Jenny Kelley

## 2016-08-24 NOTE — H&P (Signed)
History and Physical    Jenny Kelley IRJ:[id number] DOB: 01-Jun-1950 DOA: 08/24/2016  PCP: Benito Mccreedy, MD Patient coming from: home  Chief Complaint: left sided weakness  HPI: Jenny Kelley is a delightful 66 y.o. female with medical history significant for diabetes, hypertension, hyperlipidemia presents to the emergency department with the chief complaint of left-sided weakness. Code stroke called in the emergency department CTA negative. Evaluated by neurology who opined ischemic lacunar infarct recommended admission  Information is obtained from the patient and her husband is at the bedside. She reports she was in her usual state of health until this morning she awakened as well as she was having left sided arm and leg weakness and numbness. She reports "I tried to get out of bed and could not". Associated symptoms include numbness and tingling of her left leg. She and her husband deny any difficulty speaking or slurred speech. She denies difficulty swallowing. She states she attempted to bear weight and was unable to do so. She lay back down and decided to wait and see if it got better. It did not. She does report intermittent headaches over the last 2 weeks. She did not have a headache upon presentation. No recent falls or head injury. She does not take anticoagulations. She denies chest pain palpitation shortness of breath cough fever chills. She denies abdominal pain nausea vomiting diarrhea constipation melena or bright red blood per rectum. She denies dysuria hematuria frequency or urgency.    ED Course: In emergency department she is provided with 324 mg of aspirin and 500 mils of normal saline. She is afebrile with a blood pressure the high end of normal. She's not hypoxic.  Review of Systems: As per HPI otherwise all other systems reviewed and are negative.   Ambulatory Status: Ambulates independently is independent with ADLs  Past Medical History:  Diagnosis Date  .  Anemia   . Diabetes mellitus without complication ([place])   . HTN (hypertension)   . Hyperlipidemia   . Left-sided weakness     History reviewed. No pertinent surgical history.  Social History   Social History  . Marital status: Married    Spouse name: N/A  . Number of children: N/A  . Years of education: N/A   Occupational History  . Not on file.   Social History Main Topics  . Smoking status: Never Smoker  . Smokeless tobacco: Never Used  . Alcohol use No  . Drug use: No  . Sexual activity: No   Other Topics Concern  . Not on file   Social History Narrative  . No narrative on file  She is husband has done so for the last 49 years. She is currently self-employed as a Psychiatric nurse.  No Known Allergies  Family History  Problem Relation Age of Onset  . Cancer Mother        unknown type  . Hypertension Father     Prior to Admission medications   Medication Sig Start Date End Date Taking? Authorizing Provider  aspirin 81 MG chewable tablet Chew 81 mg by mouth daily.   Yes [provider]  atorvastatin (LIPITOR) 40 MG tablet Take 40 mg by mouth daily.   Yes [provider]  cholecalciferol (VITAMIN D) 1000 units tablet Take 1,000 Units by mouth daily.   Yes [provider]  gabapentin (NEURONTIN) 100 MG capsule Take 200 mg by mouth at bedtime.   Yes [provider]  metFORMIN (GLUCOPHAGE) 1000 MG tablet Take 1,000 mg  by mouth 2 (two) times daily with a meal.   Yes [provider]  pravastatin (PRAVACHOL) 20 MG tablet Take 20 mg by mouth at bedtime.   Yes [provider]  ramipril (ALTACE) 10 MG capsule Take 10 mg by mouth daily.   Yes [provider]    Physical Exam: Vitals:   08/24/16 0915 08/24/16 0921 08/24/16 0930 08/24/16 0945  BP: (!) 200/78  (!) 201/77 (!) 201/95  Pulse: 61  63 65  Resp: 18  13 16   Temp:   97.9 F (36.6 C)   TempSrc:      SpO2: 98%  97% 100%  Weight:  91.7 kg (202 lb 2.6 oz)         General:  Appears calm and comfortable, sitting up in bed in no acute distress Eyes:  PERRL, EOMI, normal lids, iris ENT:  grossly normal hearing, lips & tongue, mmm Neck:  no LAD, masses or thyromegaly Cardiovascular:  RRR, no m/r/g. No LE edema. Pedal pulses palpable Respiratory:  CTA bilaterally, no w/r/r. Normal respiratory effort. Abdomen:  soft, ntnd, positive bowel sounds no guarding or rebounding Skin:  no rash or induration seen on limited exam Musculoskeletal:  grossly normal tone BUE/BLE, good ROM, no bony abnormality Psychiatric:  grossly normal mood and affect, speech fluent and appropriate, AOx3 Neurologic:  CN 2-12 grossly intact, moves all extremities in coordinated fashion, sensation intact speech clear facial symmetry. Right grip 5/5 left grip 4/5 right lower extremity strength 5/5 left lower extremity strength 3/5  Labs on Admission: I have personally reviewed following labs and imaging studies  CBC:  Recent Labs Lab 08/24/16 0831 08/24/16 0843  WBC 6.1  --   NEUTROABS 3.5  --   HGB 10.9* 12.6  HCT 34.7* 37.0  MCV 92.5  --   PLT 278  --    Basic Metabolic Panel:  Recent Labs Lab 08/24/16 0831 08/24/16 0843  NA 140 142  K 3.9 3.9  CL 106 104  CO2 27  --   GLUCOSE 218* 216*  BUN 13 16  CREATININE 0.76 0.60  CALCIUM 9.8  --    GFR: CrCl cannot be calculated (Unknown ideal weight.). Liver Function Tests:  Recent Labs Lab 08/24/16 0831  AST 24  ALT 18  ALKPHOS 64  BILITOT 0.4  PROT 7.1  ALBUMIN 4.2   No results for input(s): LIPASE, AMYLASE in the last 168 hours. No results for input(s): AMMONIA in the last 168 hours. Coagulation Profile:  Recent Labs Lab 08/24/16 0831  INR 0.99   Cardiac Enzymes: No results for input(s): CKTOTAL, CKMB, CKMBINDEX, TROPONINI in the last 168 hours. BNP (last 3 results) No results for input(s): PROBNP in the last 8760 hours. HbA1C: No results for input(s): HGBA1C in the last 72  hours. CBG:  Recent Labs Lab 08/24/16 0831  GLUCAP 192*   Lipid Profile:  Recent Labs  08/24/16 0939  CHOL 190  HDL 67  LDLCALC 99  TRIG 121  CHOLHDL 2.8   Thyroid Function Tests: No results for input(s): TSH, T4TOTAL, FREET4, T3FREE, THYROIDAB in the last 72 hours. Anemia Panel: No results for input(s): VITAMINB12, FOLATE, FERRITIN, TIBC, IRON, RETICCTPCT in the last 72 hours. Urine analysis: No results found for: COLORURINE, APPEARANCEUR, LABSPEC, PHURINE, GLUCOSEU, HGBUR, BILIRUBINUR, KETONESUR, PROTEINUR, UROBILINOGEN, NITRITE, LEUKOCYTESUR  Creatinine Clearance: CrCl cannot be calculated (Unknown ideal weight.).  Sepsis Labs: @LABRCNTIP (procalcitonin:4,lacticidven:4) )No results found for this or any previous visit (from the past 240 hour(s)).   Radiological  Exams on Admission: Ct Angio Head W Or Wo Contrast  Result Date: 08/24/2016 CLINICAL DATA:  Code stroke.  Left-sided weakness since 3:30 a.m. EXAM: CT ANGIOGRAPHY HEAD AND NECK TECHNIQUE: Multidetector CT imaging of the head and neck was performed using the standard protocol during bolus administration of intravenous contrast. Multiplanar CT image reconstructions and MIPs were obtained to evaluate the vascular anatomy. Carotid stenosis measurements (when applicable) are obtained utilizing NASCET criteria, using the distal internal carotid diameter as the denominator. CONTRAST:  50 cc Isovue 370 intravenous COMPARISON:  Head CT from earlier today FINDINGS: CTA NECK FINDINGS Aortic arch: Atherosclerotic plaque without acute finding or dilatation. Two vessel branching pattern Right carotid system: Moderate predominately noncalcified plaque of the proximal ICA without flow limiting stenosis, ulceration, or dissection. Left carotid system: Atheromatous thickening of the common carotid wall. Moderate calcified and noncalcified plaque at the ICA bulb. No flow limiting stenosis, ulceration, or dissection. Vertebral arteries:  Proximal subclavian atherosclerosis without flow limiting stenosis. There is high-grade narrowing of the right vertebral artery due to calcified and noncalcified plaque. No indication of dissection. Skeleton: No acute or aggressive finding Other neck: Negative Upper chest: No acute finding Review of the MIP images confirms the above findings CTA HEAD FINDINGS Anterior circulation: Mild calcified plaque on the carotid siphons. There is advanced wasting of the proximal right M2 superior division branch. No branch occlusion or central thrombus appearance. Probable mild atherosclerotic narrowing of the left M1 segment and a proximal M2 branch. No generalized vessel beading. Negative for aneurysm. Posterior circulation: Right dominant. Much of the left vertebral artery flow is into the PICA hypoplastic right P1 segment. There is a dominant right AICA. Vertebral, basilar, and posterior cerebral arteries are smooth and widely patent. Venous sinuses: Patent Anatomic variants: None other than described above. Delayed phase: Not performed in the emergent setting. A page was placed 08/24/2016  at 9:25 am to Dr. Samara Snide . Review of the MIP images confirms the above findings IMPRESSION: 1. Negative for emergent large vessel occlusion. 2. High-grade proximal right M2 branch stenosis. 3. Moderate atherosclerotic plaque in the cervical carotids without flow limiting stenosis or ulceration. 4. High-grade atheromatous narrowing of the right V1 segment. Electronically Signed   By: Monte Fantasia M.D.   On: 08/24/2016 09:30   Ct Angio Neck W Or Wo Contrast  Result Date: 08/24/2016 CLINICAL DATA:  Code stroke.  Left-sided weakness since 3:30 a.m. EXAM: CT ANGIOGRAPHY HEAD AND NECK TECHNIQUE: Multidetector CT imaging of the head and neck was performed using the standard protocol during bolus administration of intravenous contrast. Multiplanar CT image reconstructions and MIPs were obtained to evaluate the vascular anatomy.  Carotid stenosis measurements (when applicable) are obtained utilizing NASCET criteria, using the distal internal carotid diameter as the denominator. CONTRAST:  50 cc Isovue 370 intravenous COMPARISON:  Head CT from earlier today FINDINGS: CTA NECK FINDINGS Aortic arch: Atherosclerotic plaque without acute finding or dilatation. Two vessel branching pattern Right carotid system: Moderate predominately noncalcified plaque of the proximal ICA without flow limiting stenosis, ulceration, or dissection. Left carotid system: Atheromatous thickening of the common carotid wall. Moderate calcified and noncalcified plaque at the ICA bulb. No flow limiting stenosis, ulceration, or dissection. Vertebral arteries: Proximal subclavian atherosclerosis without flow limiting stenosis. There is high-grade narrowing of the right vertebral artery due to calcified and noncalcified plaque. No indication of dissection. Skeleton: No acute or aggressive finding Other neck: Negative Upper chest: No acute finding Review of the MIP images confirms the  above findings CTA HEAD FINDINGS Anterior circulation: Mild calcified plaque on the carotid siphons. There is advanced wasting of the proximal right M2 superior division branch. No branch occlusion or central thrombus appearance. Probable mild atherosclerotic narrowing of the left M1 segment and a proximal M2 branch. No generalized vessel beading. Negative for aneurysm. Posterior circulation: Right dominant. Much of the left vertebral artery flow is into the PICA hypoplastic right P1 segment. There is a dominant right AICA. Vertebral, basilar, and posterior cerebral arteries are smooth and widely patent. Venous sinuses: Patent Anatomic variants: None other than described above. Delayed phase: Not performed in the emergent setting. A page was placed 08/24/2016  at 9:25 am to Dr. Samara Snide . Review of the MIP images confirms the above findings IMPRESSION: 1. Negative for emergent large vessel  occlusion. 2. High-grade proximal right M2 branch stenosis. 3. Moderate atherosclerotic plaque in the cervical carotids without flow limiting stenosis or ulceration. 4. High-grade atheromatous narrowing of the right V1 segment. Electronically Signed   By: Monte Fantasia M.D.   On: 08/24/2016 09:30   Ct Head Code Stroke W/o Cm  Result Date: 08/24/2016 CLINICAL DATA:  Code stroke. Left-sided weakness and upper chest pain. EXAM: CT HEAD WITHOUT CONTRAST TECHNIQUE: Contiguous axial images were obtained from the base of the skull through the vertex without intravenous contrast. COMPARISON:  None. FINDINGS: Brain: No evidence of acute infarction, hemorrhage, hydrocephalus, extra-axial collection or mass lesion/mass effect. Mild low-density in the cerebral white matter, predominately periventricular, usually from chronic small vessel ischemia. Vascular: No hyperdense vessel. Skull: No acute finding Sinuses/Orbits: Bilateral cataract resection.  No acute finding. Other:Text page with prelim sent on 08/24/2016 at 8:47 am to Dr. Lorraine Lax. ASPECTS [facility] Stroke Program Early CT Score) - Ganglionic level infarction (caudate, lentiform nuclei, internal capsule, insula, M1-M3 cortex): 7 - Supraganglionic infarction (M4-M6 cortex): 3 Total score (0-10 with 10 being normal): 10 IMPRESSION: 1. No acute finding. ASPECTS is 10. 2. Mild white matter disease. Electronically Signed   By: Monte Fantasia M.D.   On: 08/24/2016 08:49    EKG: Independently reviewed. Sinus rhythm Minimal ST depression, lateral leads Baseline wander in lead(s) I II aVR   Assessment/Plan Principal Problem:   Left-sided weakness Active Problems:   Diabetes mellitus without complication ([facility])   HTN (hypertension)   Hyperlipidemia   Anemia   #1. Left-sided weakness. Was factors include diabetes, hypertension, hyperlipidemia, former smoker. CT of the head without abnormality. Troponin negative. EKG as noted above. Code stroke was called. Evaluated  by neurology opined she is outside the window. Continues with left-sided weakness. Concern for lacunar infarct. Recommended admission and stroke workup. -Admit to telemetry -Obtain an MRA -Obtain 2-D echo -Speech therapy/occupational therapy -Continue aspirin and statin -She passed bedside swallow eval so will continue car modified diet  #2. Hypertension. Blood pressure 201/95 in the emergency department. Home medications include ramapril. Of note patient reports this medications dosage recently increased by primary care provider -Hold ramapril for now -monitor -resume as indicated  #3. Diabetes. Serum glucose 215 on admission. Home medications include metformin and Neurontin. Of note dosage on metformin recently increased by primary care provider -We'll hold metformin for now -Obtain a hemoglobin A1c -Sliding scale insulin for optimal control  #4. Hyperlipidemia.  -Continue home statin  #5. Anemia. Hemoglobin 10.9. No s/sx active bleeding -anemia panel  DVT prophylaxis: scd/lovenox  Code Status: full  Family Communication: husband at bedside  Disposition Plan: home when ready  Consults called:  Stroke team Admission  status: obs    Radene Gunning MD [facility]  If 7PM-7AM, please contact night-coverage [email or web address] Password [email or web address]  08/24/2016, 10:02 AM

## 2016-08-24 NOTE — ED Provider Notes (Signed)
[facility] DEPT Provider Note   CSN: [id number] Arrival date & time: 08/24/16  7096   An emergency department physician performed an initial assessment on this suspected stroke patient at [phone].  History   Chief Complaint Chief Complaint  Patient presents with  . Code Stroke    HPI Jenny Kelley is a 66 y.o. female.  Jenny Kelley is a 66 y.o. Female with a history of hypertension and diabetes who presents to the ED with her husband as a code stroke. Patient reports around 3 AM this morning she realized she was having left sided arm and leg numbness and weakness. She tried to stand up and could not. She laid on the couch hoping this would get better and it did not. She presented to the emergency department as a code stroke. In my evaluation patient still reports numbness and weakness to her left arm and leg. She reports she's been having intermittent headaches over the past 1-2 weeks. No current headache. No head injury. She's not on any anticoagulants. She is not a smoker. No history of previous strokes. She denies fevers, coughing, chest pain, shortness of breath, abdominal pain, nausea, vomiting, diarrhea, urinary symptoms, rashes, changes to her vision, current headache, or neck pain.    The history is provided by the patient, medical records and the spouse. No language interpreter was used.    Past Medical History:  Diagnosis Date  . Anemia   . Diabetes mellitus without complication ([place])   . HTN (hypertension)   . Hyperlipidemia   . Left-sided weakness     Patient Active Problem List   Diagnosis Date Noted  . Left-sided weakness 08/24/2016  . Diabetes mellitus without complication ([place])   . HTN (hypertension)   . Hyperlipidemia   . Anemia     History reviewed. No pertinent surgical history.  OB History    No data available       Home Medications    Prior to Admission medications   Not on File    Family History No family history on file.  Social  History Social History  Substance Use Topics  . Smoking status: Never Smoker  . Smokeless tobacco: Never Used  . Alcohol use No     Allergies   Patient has no known allergies.   Review of Systems Review of Systems  Constitutional: Negative for chills and fever.  HENT: Negative for congestion and sore throat.   Eyes: Negative for pain and visual disturbance.  Respiratory: Negative for cough and shortness of breath.   Cardiovascular: Negative for chest pain.  Gastrointestinal: Negative for abdominal pain, diarrhea, nausea and vomiting.  Genitourinary: Negative for dysuria.  Musculoskeletal: Negative for back pain and neck pain.  Skin: Negative for rash.  Neurological: Positive for weakness and numbness. Negative for dizziness, syncope, speech difficulty and headaches.     Physical Exam Updated Vital Signs BP (!) 201/77   Pulse 63   Temp 97.9 F (36.6 C)   Resp 13   Wt 91.7 kg (202 lb 2.6 oz)   SpO2 97%   Physical Exam  Constitutional: She is oriented to person, place, and time. She appears well-developed and well-nourished. No distress.  Nontoxic appearing.  HENT:  Head: Normocephalic and atraumatic.  Right Ear: External ear normal.  Left Ear: External ear normal.  Mouth/Throat: Oropharynx is clear and moist.  Eyes: Pupils are equal, round, and reactive to light. Conjunctivae and EOM are normal. Right eye exhibits no discharge. Left eye exhibits no  discharge.  Neck: Neck supple. No JVD present.  Cardiovascular: Normal rate, regular rhythm, normal heart sounds and intact distal pulses.  Exam reveals no gallop and no friction rub.   No murmur heard. Pulmonary/Chest: Effort normal and breath sounds normal. No stridor. No respiratory distress. She has no wheezes. She has no rales.  Lungs are clear to ascultation bilaterally. Symmetric chest expansion bilaterally. No increased work of breathing. No rales or rhonchi.    Abdominal: Soft. There is no tenderness. There is no  guarding.  Musculoskeletal: She exhibits no edema.  No extremity edema.   Lymphadenopathy:    She has no cervical adenopathy.  Neurological: She is alert and oriented to person, place, and time. No cranial nerve deficit.  3/5 strength to her left arm and leg. 5/5 right UE and LE strength. 3/5 left grip strength with 5/5 right grip strength. No facial droop noted. Speech is clear and coherent. CNs are intact. Vision is grossly intact.   Skin: Skin is warm and dry. Capillary refill takes less than 2 seconds. No rash noted. She is not diaphoretic. No erythema. No pallor.  Psychiatric: She has a normal mood and affect. Her behavior is normal.  Nursing note and vitals reviewed.    ED Treatments / Results  Labs (all labs ordered are listed, but only abnormal results are displayed) Labs Reviewed  CBC - Abnormal; Notable for the following:       Result Value   RBC 3.75 (*)    Hemoglobin 10.9 (*)    HCT 34.7 (*)    All other components within normal limits  COMPREHENSIVE METABOLIC PANEL - Abnormal; Notable for the following:    Glucose, Bld 218 (*)    All other components within normal limits  CBG MONITORING, ED - Abnormal; Notable for the following:    Glucose-Capillary 192 (*)    All other components within normal limits  I-STAT CHEM 8, ED - Abnormal; Notable for the following:    Glucose, Bld 216 (*)    All other components within normal limits  PROTIME-INR  APTT  DIFFERENTIAL  HEMOGLOBIN A1C  LIPID PANEL  URINALYSIS, COMPLETE (UACMP) WITH MICROSCOPIC  I-STAT TROPONIN, ED    EKG  EKG Interpretation  Date/Time:  Monday August 24 2016 09:04:21 EDT Ventricular Rate:  78 PR Interval:    QRS Duration: 79 QT Interval:  375 QTC Calculation: 428 R Axis:   59 Text Interpretation:  Sinus rhythm Minimal ST depression, lateral leads Baseline wander in lead(s) I II aVR Confirmed by Isla Pence [phone]) on 08/24/2016 9:13:34 AM Also confirmed by Isla Pence [phone]), editor  Laurena Spies [phone])  on 08/24/2016 9:39:57 AM       Radiology Ct Angio Head W Or Wo Contrast  Result Date: 08/24/2016 CLINICAL DATA:  Code stroke.  Left-sided weakness since 3:30 a.m. EXAM: CT ANGIOGRAPHY HEAD AND NECK TECHNIQUE: Multidetector CT imaging of the head and neck was performed using the standard protocol during bolus administration of intravenous contrast. Multiplanar CT image reconstructions and MIPs were obtained to evaluate the vascular anatomy. Carotid stenosis measurements (when applicable) are obtained utilizing NASCET criteria, using the distal internal carotid diameter as the denominator. CONTRAST:  50 cc Isovue 370 intravenous COMPARISON:  Head CT from earlier today FINDINGS: CTA NECK FINDINGS Aortic arch: Atherosclerotic plaque without acute finding or dilatation. Two vessel branching pattern Right carotid system: Moderate predominately noncalcified plaque of the proximal ICA without flow limiting stenosis, ulceration, or dissection. Left carotid system: Atheromatous thickening of  the common carotid wall. Moderate calcified and noncalcified plaque at the ICA bulb. No flow limiting stenosis, ulceration, or dissection. Vertebral arteries: Proximal subclavian atherosclerosis without flow limiting stenosis. There is high-grade narrowing of the right vertebral artery due to calcified and noncalcified plaque. No indication of dissection. Skeleton: No acute or aggressive finding Other neck: Negative Upper chest: No acute finding Review of the MIP images confirms the above findings CTA HEAD FINDINGS Anterior circulation: Mild calcified plaque on the carotid siphons. There is advanced wasting of the proximal right M2 superior division branch. No branch occlusion or central thrombus appearance. Probable mild atherosclerotic narrowing of the left M1 segment and a proximal M2 branch. No generalized vessel beading. Negative for aneurysm. Posterior circulation: Right dominant. Much of the left  vertebral artery flow is into the PICA hypoplastic right P1 segment. There is a dominant right AICA. Vertebral, basilar, and posterior cerebral arteries are smooth and widely patent. Venous sinuses: Patent Anatomic variants: None other than described above. Delayed phase: Not performed in the emergent setting. A page was placed 08/24/2016  at 9:25 am to Dr. Samara Snide . Review of the MIP images confirms the above findings IMPRESSION: 1. Negative for emergent large vessel occlusion. 2. High-grade proximal right M2 branch stenosis. 3. Moderate atherosclerotic plaque in the cervical carotids without flow limiting stenosis or ulceration. 4. High-grade atheromatous narrowing of the right V1 segment. Electronically Signed   By: Monte Fantasia M.D.   On: 08/24/2016 09:30   Ct Angio Neck W Or Wo Contrast  Result Date: 08/24/2016 CLINICAL DATA:  Code stroke.  Left-sided weakness since 3:30 a.m. EXAM: CT ANGIOGRAPHY HEAD AND NECK TECHNIQUE: Multidetector CT imaging of the head and neck was performed using the standard protocol during bolus administration of intravenous contrast. Multiplanar CT image reconstructions and MIPs were obtained to evaluate the vascular anatomy. Carotid stenosis measurements (when applicable) are obtained utilizing NASCET criteria, using the distal internal carotid diameter as the denominator. CONTRAST:  50 cc Isovue 370 intravenous COMPARISON:  Head CT from earlier today FINDINGS: CTA NECK FINDINGS Aortic arch: Atherosclerotic plaque without acute finding or dilatation. Two vessel branching pattern Right carotid system: Moderate predominately noncalcified plaque of the proximal ICA without flow limiting stenosis, ulceration, or dissection. Left carotid system: Atheromatous thickening of the common carotid wall. Moderate calcified and noncalcified plaque at the ICA bulb. No flow limiting stenosis, ulceration, or dissection. Vertebral arteries: Proximal subclavian atherosclerosis without flow  limiting stenosis. There is high-grade narrowing of the right vertebral artery due to calcified and noncalcified plaque. No indication of dissection. Skeleton: No acute or aggressive finding Other neck: Negative Upper chest: No acute finding Review of the MIP images confirms the above findings CTA HEAD FINDINGS Anterior circulation: Mild calcified plaque on the carotid siphons. There is advanced wasting of the proximal right M2 superior division branch. No branch occlusion or central thrombus appearance. Probable mild atherosclerotic narrowing of the left M1 segment and a proximal M2 branch. No generalized vessel beading. Negative for aneurysm. Posterior circulation: Right dominant. Much of the left vertebral artery flow is into the PICA hypoplastic right P1 segment. There is a dominant right AICA. Vertebral, basilar, and posterior cerebral arteries are smooth and widely patent. Venous sinuses: Patent Anatomic variants: None other than described above. Delayed phase: Not performed in the emergent setting. A page was placed 08/24/2016  at 9:25 am to Dr. Samara Snide . Review of the MIP images confirms the above findings IMPRESSION: 1. Negative for emergent large vessel occlusion.  2. High-grade proximal right M2 branch stenosis. 3. Moderate atherosclerotic plaque in the cervical carotids without flow limiting stenosis or ulceration. 4. High-grade atheromatous narrowing of the right V1 segment. Electronically Signed   By: Monte Fantasia M.D.   On: 08/24/2016 09:30   Ct Head Code Stroke W/o Cm  Result Date: 08/24/2016 CLINICAL DATA:  Code stroke. Left-sided weakness and upper chest pain. EXAM: CT HEAD WITHOUT CONTRAST TECHNIQUE: Contiguous axial images were obtained from the base of the skull through the vertex without intravenous contrast. COMPARISON:  None. FINDINGS: Brain: No evidence of acute infarction, hemorrhage, hydrocephalus, extra-axial collection or mass lesion/mass effect. Mild low-density in the  cerebral white matter, predominately periventricular, usually from chronic small vessel ischemia. Vascular: No hyperdense vessel. Skull: No acute finding Sinuses/Orbits: Bilateral cataract resection.  No acute finding. Other:Text page with prelim sent on 08/24/2016 at 8:47 am to Dr. Lorraine Lax. ASPECTS [facility] Stroke Program Early CT Score) - Ganglionic level infarction (caudate, lentiform nuclei, internal capsule, insula, M1-M3 cortex): 7 - Supraganglionic infarction (M4-M6 cortex): 3 Total score (0-10 with 10 being normal): 10 IMPRESSION: 1. No acute finding. ASPECTS is 10. 2. Mild white matter disease. Electronically Signed   By: Monte Fantasia M.D.   On: 08/24/2016 08:49    Procedures Procedures (including critical care time)  Medications Ordered in ED Medications   stroke: mapping our early stages of recovery book (not administered)  0.9 %  sodium chloride infusion (not administered)  acetaminophen (TYLENOL) tablet 650 mg (not administered)    Or  acetaminophen (TYLENOL) solution 650 mg (not administered)    Or  acetaminophen (TYLENOL) suppository 650 mg (not administered)  senna-docusate (Senokot-S) tablet 1 tablet (not administered)  enoxaparin (LOVENOX) injection 40 mg (not administered)  aspirin suppository 300 mg (not administered)    Or  aspirin tablet 325 mg (not administered)  insulin aspart (novoLOG) injection 0-9 Units (not administered)  insulin aspart (novoLOG) injection 0-5 Units (not administered)  iopamidol (ISOVUE-370) 76 % injection (50 mLs  Contrast Given 08/24/16 0900)  sodium chloride 0.9 % bolus 500 mL (500 mLs Intravenous New Bag/Given 08/24/16 0934)  aspirin chewable tablet 324 mg (324 mg Oral Given 08/24/16 0931)     Initial Impression / Assessment and Plan / ED Course  I have reviewed the triage vital signs and the nursing notes.  Pertinent labs & imaging results that were available during my care of the patient were reviewed by me and considered in my medical  decision making (see chart for details).    This is a 66 y.o. Female with a history of hypertension and diabetes who presents to the ED with her husband as a code stroke. Patient reports around 3 AM this morning she realized she was having left sided arm and leg numbness and weakness. She tried to stand up and could not. She laid on the couch hoping this would get better and it did not. She presented to the emergency department as a code stroke. In my evaluation patient still reports numbness and weakness to her left arm and leg. She reports she's been having intermittent headaches over the past 1-2 weeks. No current headache. No head injury. She's not on any anticoagulants. She is not a smoker. No history of previous strokes. On exam patient is afebrile nontoxic appearing. She does have 3 out of 5 strength noted to her left upper and lower extremity. No other focal neurological deficits noted by myself. She is hypertensive in the emergency department.  I spoke with  Neurologist Dr. Lorraine Lax who reports head CT initially looks fine. He suspects an acute ischemic lacunar infarct. He would like her admitted to medicine for stroke. She is outside the window for TPA.  ASA 324 mg provided. 500 mL flu bolus provided. Patient and husband agree with plan for admission.   I consulted with [name] from [facility] who accepted the patient for admission.    Final Clinical Impressions(s) / ED Diagnoses   Final diagnoses:  Stroke ([place])  Diabetes mellitus without complication ([place])  Essential hypertension  Other hyperlipidemia  Anemia, unspecified type  Left-sided weakness    New Prescriptions New Prescriptions   No medications on file     Waynetta Pean, Hershal Coria 08/24/16 [id number]    Isla Pence, MD 08/24/16 1049

## 2016-08-24 NOTE — ED Provider Notes (Signed)
MSE was initiated and I personally evaluated the patient and placed orders (if any) at  8:32 AM on August 24, 2016.  The patient appears stable so that the remainder of the MSE may be completed by another provider.  Jenny Kelley is a 66 y.o. female hx of DM, HTM here with L sided weakness. Has been up all night and left arm progressively weaker. Around 3 am, she noticed that she couldn't move L leg. Denies trouble with speech. On exam, strength 3/5 L arm and L leg, 5/5 R side. No obvious slurred speech. Some L sided facial droop. Outside of 4 hr TPA window but given significant deficit and patient within 12 hr interventional window, code stroke activated and orders placed     Drenda Freeze, MD 08/24/16 [phone]

## 2016-08-24 NOTE — Consult Note (Signed)
Requesting Physician: Dr. Gilford Raid, J    Chief Complaint:  CODE STROKE  History obtained from:  Patient     HPI:                                                                                                                                         Jenny Kelley is an 66 y.o. female [place] husband had been complaining left-sided weakness and chest pain throughout the day yesterday and this morning. At approximately 3 AM this morning patient was awoken by her sleep and felt as though although her chest pain continued her left-sided weakness was worsened. Patient try to get out of bed and was unable to. Patient was also complaining of some left arm pain that radiated from shoulder down. Patient was brought to the fire department where she was then brought in via ambulance. Patient was called as a code stroke once in the ED. Upon arrival patient still had mild left arm and leg weakness. CT of head was obtained showing no acute stroke. CTA of head and neck were then also obtained. Patient states she does take aspirin, she is a diabetic, she does have hyperlipidemia, and hypertension.  Date last known well: Date: 08/24/2016 Time last known well: Time: 03:00 tPA Given: No: minimal symptoms and out of the window   Modified Rankin: Rankin Score=0   Past Medical History:  Diagnosis Date  . Diabetes mellitus without complication ([place])     No past surgical history on file.  No family history on file. Social History:  has no tobacco, alcohol, and drug history on file.  Allergies: No Known Allergies  Medications:                                                                                                                           Patient states she is on metformin, medications for her hypertension and hyperlipidemia  ROS:  History obtained from the  patient  General ROS: negative for - chills, fatigue, fever, night sweats, weight gain or weight loss Psychological ROS: negative for - behavioral disorder, hallucinations, memory difficulties, mood swings or suicidal ideation Ophthalmic ROS: negative for - blurry vision, double vision, eye pain or loss of vision ENT ROS: negative for - epistaxis, nasal discharge, oral lesions, sore throat, tinnitus or vertigo Allergy and Immunology ROS: negative for - hives or itchy/watery eyes Hematological and Lymphatic ROS: negative for - bleeding problems, bruising or swollen lymph nodes Endocrine ROS: negative for - galactorrhea, hair pattern changes, polydipsia/polyuria or temperature intolerance Respiratory ROS: negative for - cough, hemoptysis, shortness of breath or wheezing Cardiovascular ROS: Positive for - chest pain, Gastrointestinal ROS: negative for - abdominal pain, diarrhea, hematemesis, nausea/vomiting or stool incontinence Genito-Urinary ROS: negative for - dysuria, hematuria, incontinence or urinary frequency/urgency Musculoskeletal ROS: negative for -  muscular weakness Neurological ROS: as noted in HPI Dermatological ROS: negative for rash and skin lesion changes  Neurologic Examination:                                                                                                      Blood pressure (!) 194/87, pulse 68, resp. rate 16, SpO2 99 %.  HEENT-  Normocephalic, no lesions, without obvious abnormality.  Normal external eye and conjunctiva.  Normal TM's bilaterally.  Normal auditory canals and external ears. Normal external nose, mucus membranes and septum.  Normal pharynx. Cardiovascular- S1, S2 normal, pulses palpable throughout   Lungs- chest clear, no wheezing, rales, normal symmetric air entry, Heart exam - S1, S2 normal, no murmur, no gallop, rate regular Abdomen- normal findings: bowel sounds normal Extremities- no edema Lymph-no adenopathy palpable Musculoskeletal-no  joint tenderness, deformity or swelling Skin-warm and dry, no hyperpigmentation, vitiligo, or suspicious lesions  Neurological Examination Mental Status: Alert, oriented, thought content appropriate.  Speech fluent without evidence of aphasia.  Able to follow 3 step commands without difficulty. Cranial Nerves: II: Discs flat bilaterally; Visual fields grossly normal,  III,IV, VI: ptosis not present, extra-ocular motions intact bilaterally, pupils equal, round, reactive to light and accommodation V,VII: smile symmetric, facial light touch sensation normal bilaterally VIII: hearing normal bilaterally IX,X: uvula rises symmetrically XI: bilateral shoulder shrug XII: midline tongue extension Motor: Right : Upper extremity   5/5    Left:     Upper extremity   4/5  Lower extremity   5/5     Lower extremity   3/5 Tone and bulk:normal tone throughout; no atrophy noted Sensory: Pinprick and light touch intact throughout, bilaterally Deep Tendon Reflexes: 2+ and symmetric throughout UE and KJ no AJ Plantars: Right: downgoing   Left: downgoing Cerebellar: Mild left dysmetria with  finger-to-nose, unable to take part in left Heel to shin due to weakness Gait: not tested       Lab Results: Basic Metabolic Panel:  Recent Labs Lab 08/24/16 0843  NA 142  K 3.9  CL 104  GLUCOSE 216*  BUN 16  CREATININE 0.60    Liver Function Tests: No results for input(s): AST,  ALT, ALKPHOS, BILITOT, PROT, ALBUMIN in the last 168 hours. No results for input(s): LIPASE, AMYLASE in the last 168 hours. No results for input(s): AMMONIA in the last 168 hours.  CBC:  Recent Labs Lab 08/24/16 0831 08/24/16 0843  WBC 6.1  --   NEUTROABS 3.5  --   HGB 10.9* 12.6  HCT 34.7* 37.0  MCV 92.5  --   PLT 278  --     Cardiac Enzymes: No results for input(s): CKTOTAL, CKMB, CKMBINDEX, TROPONINI in the last 168 hours.  Lipid Panel: No results for input(s): CHOL, TRIG, HDL, CHOLHDL, VLDL, LDLCALC in the  last 168 hours.  CBG:  Recent Labs Lab 08/24/16 0831  GLUCAP 192*    Microbiology: No results found for this or any previous visit.  Coagulation Studies: No results for input(s): LABPROT, INR in the last 72 hours.  Imaging: Ct Head Code Stroke W/o Cm  Result Date: 08/24/2016 CLINICAL DATA:  Code stroke. Left-sided weakness and upper chest pain. EXAM: CT HEAD WITHOUT CONTRAST TECHNIQUE: Contiguous axial images were obtained from the base of the skull through the vertex without intravenous contrast. COMPARISON:  None. FINDINGS: Brain: No evidence of acute infarction, hemorrhage, hydrocephalus, extra-axial collection or mass lesion/mass effect. Mild low-density in the cerebral white matter, predominately periventricular, usually from chronic small vessel ischemia. Vascular: No hyperdense vessel. Skull: No acute finding Sinuses/Orbits: Bilateral cataract resection.  No acute finding. Other:Text page with prelim sent on 08/24/2016 at 8:47 am to Dr. Lorraine Lax. ASPECTS [facility] Stroke Program Early CT Score) - Ganglionic level infarction (caudate, lentiform nuclei, internal capsule, insula, M1-M3 cortex): 7 - Supraganglionic infarction (M4-M6 cortex): 3 Total score (0-10 with 10 being normal): 10 IMPRESSION: 1. No acute finding. ASPECTS is 10. 2. Mild white matter disease. Electronically Signed   By: Monte Fantasia M.D.   On: 08/24/2016 08:49       Assessment and plan discussed with with attending physician and they are in agreement.    Etta Quill PA-C [facility] [phone]  08/24/2016, 9:06 AM   Assessment: 66 y.o. female presenting to hospital with CP and left arm/leg weakness. Exam notable for left arm and leg weakness and some functional aspects of weakness. It was also noted that patient had some ataxia in the left arm. CT head without abnormality. CTA of head showed no large vessel occlusion however did show some M2 stenosis.    Acute Ischemic stroke  NIHSS: 5  Stroke  Risk Factors - diabetes mellitus, hyperlipidemia and hypertension  Mechanism of stroke: needs evaluation, likely lacunar vs ICAD  Recommend 1. MRI of the brain without contrast 2. Transthoracic Echo  3. Start patient on ASA 81mg ,load with 600 mg Plavix today and continue 75mg  daily (initial dual antiplatelet therapy per results of POINT, CHANCE trials). Duration of dual antiplatelet treatment usually 3 weeks to 3 months and will decided by stroke team based on mechanism of stroke.  4.Start or continue Atorvastatin 80 mg/other high intensity statin 5. BP goal: permissive HTN upto 759 systolic, PRNs above 163 6. HBAIC and Lipid profile 7. Telemetry monitoring 8. Frequent neuro checks 9. NPO until passes stroke swallow screen 10. please page stroke NP  Or  PA  Or MD from 8am -4 pm  as this patient from this time will be  followed by the stroke.   You can look them up on [email or web address]  Password [email or web address]

## 2016-08-24 NOTE — ED Notes (Signed)
Pt CBG was 192, notified Anna(RN)

## 2016-08-25 ENCOUNTER — Ambulatory Visit (HOSPITAL_COMMUNITY): Payer: Medicare Other

## 2016-08-25 ENCOUNTER — Observation Stay (HOSPITAL_BASED_OUTPATIENT_CLINIC_OR_DEPARTMENT_OTHER): Payer: Medicare Other

## 2016-08-25 DIAGNOSIS — E784 Other hyperlipidemia: Secondary | ICD-10-CM | POA: Diagnosis not present

## 2016-08-25 DIAGNOSIS — E119 Type 2 diabetes mellitus without complications: Secondary | ICD-10-CM

## 2016-08-25 DIAGNOSIS — R531 Weakness: Secondary | ICD-10-CM | POA: Diagnosis not present

## 2016-08-25 DIAGNOSIS — I6302 Cerebral infarction due to thrombosis of basilar artery: Secondary | ICD-10-CM

## 2016-08-25 DIAGNOSIS — I6789 Other cerebrovascular disease: Secondary | ICD-10-CM | POA: Diagnosis not present

## 2016-08-25 DIAGNOSIS — I10 Essential (primary) hypertension: Secondary | ICD-10-CM | POA: Diagnosis not present

## 2016-08-25 LAB — ECHOCARDIOGRAM COMPLETE
AOASC: 34 cm
CHL CUP MV DEC (S): 257
E decel time: 257 msec
E/e' ratio: 9.28
FS: 29 % (ref 28–44)
IV/PV OW: 1.12
LA diam end sys: 34 mm
LA vol index: 24.2 mL/m2
LADIAMINDEX: 1.62 cm/m2
LASIZE: 34 mm
LAVOL: 50.8 mL
LAVOLA4C: 33.7 mL
LDCA: 2.84 cm2
LV E/e' medial: 9.28
LV TDI E'LATERAL: 9.57
LVEEAVG: 9.28
LVELAT: 9.57 cm/s
LVOT diameter: 19 mm
Lateral S' vel: 11.7 cm/s
MV Peak grad: 3 mmHg
MVPKAVEL: 95.1 m/s
MVPKEVEL: 88.8 m/s
PW: 12.8 mm — AB (ref 0.6–1.1)
Reg peak vel: 242 cm/s
TAPSE: 20.7 mm
TDI e' medial: 5.87
TR max vel: 242 cm/s
WEIGHTICAEL: 3234.59 [oz_av]

## 2016-08-25 LAB — RAPID URINE DRUG SCREEN, HOSP PERFORMED
AMPHETAMINES: NOT DETECTED
BARBITURATES: NOT DETECTED
Benzodiazepines: NOT DETECTED
COCAINE: NOT DETECTED
OPIATES: NOT DETECTED
TETRAHYDROCANNABINOL: NOT DETECTED

## 2016-08-25 LAB — GLUCOSE, CAPILLARY
GLUCOSE-CAPILLARY: 227 mg/dL — AB (ref 65–99)
Glucose-Capillary: 168 mg/dL — ABNORMAL HIGH (ref 65–99)
Glucose-Capillary: 174 mg/dL — ABNORMAL HIGH (ref 65–99)

## 2016-08-25 LAB — HEMOGLOBIN A1C
Hgb A1c MFr Bld: 6.7 % — ABNORMAL HIGH (ref 4.8–5.6)
Mean Plasma Glucose: 146 mg/dL

## 2016-08-25 MED ORDER — CLOPIDOGREL BISULFATE 75 MG PO TABS
75.0000 mg | ORAL_TABLET | Freq: Every day | ORAL | Status: DC
  Administered 2016-08-25: 75 mg via ORAL
  Filled 2016-08-25: qty 1

## 2016-08-25 MED ORDER — ATORVASTATIN CALCIUM 40 MG PO TABS
40.0000 mg | ORAL_TABLET | Freq: Every day | ORAL | Status: DC
  Administered 2016-08-25: 40 mg via ORAL
  Filled 2016-08-25: qty 1

## 2016-08-25 MED ORDER — CLOPIDOGREL BISULFATE 75 MG PO TABS: 75.0000 mg | ORAL_TABLET | Freq: Every day | ORAL | 0 refills | Status: DC

## 2016-08-25 MED ORDER — ATORVASTATIN CALCIUM 40 MG PO TABS: 80.0000 mg | ORAL_TABLET | Freq: Every day | ORAL | 0 refills | Status: AC

## 2016-08-25 MED ORDER — ATORVASTATIN CALCIUM 40 MG PO TABS: 80.0000 mg | ORAL_TABLET | Freq: Every day | ORAL | 0 refills | Status: DC

## 2016-08-25 MED ORDER — ASPIRIN 325 MG PO TABS: 325.0000 mg | ORAL_TABLET | Freq: Every day | ORAL | 0 refills | Status: DC

## 2016-08-25 MED ORDER — CLOPIDOGREL BISULFATE 75 MG PO TABS: 75.0000 mg | ORAL_TABLET | Freq: Every day | ORAL | 0 refills | Status: AC

## 2016-08-25 NOTE — Evaluation (Signed)
Speech Language Pathology Evaluation Patient Details Name: Jenny Kelley MRN: [id number] DOB: 1950-03-01 Today's Date: 08/25/2016 Time: 8563-1497 SLP Time Calculation (min) (ACUTE ONLY): 13 min  Problem List:  Patient Active Problem List   Diagnosis Date Noted  . Left-sided weakness 08/24/2016  . Cerebral embolism with cerebral infarction 08/24/2016  . Diabetes mellitus without complication ([place])   . HTN (hypertension)   . Hyperlipidemia   . Anemia   . Acute ischemic stroke ([place])   . Diabetes mellitus with complication [facility])    Past Medical History:  Past Medical History:  Diagnosis Date  . Anemia   . Arthritis    "left knee" (08/24/2016)  . Colon cancer ([place])   . Headache    "sometimes monthly" (08/24/2016)  . History of blood transfusion ~ 1994   "related to colon cancer"  . HTN (hypertension)   . Hyperlipidemia   . Left-sided weakness 08/24/2016  . Stroke ([place]) 08/24/2016   "Left-sided weakness since" (08/24/2016)  . Type II diabetes mellitus ([place])    Past Surgical History:  Past Surgical History:  Procedure Laterality Date  . ABDOMINAL HYSTERECTOMY     "partial"  . CATARACT EXTRACTION W/ INTRAOCULAR LENS  IMPLANT, BILATERAL Bilateral ~ 2015  . COLECTOMY  ~ 1994   "colon cancer"   HPI:  Pt is a 66 y.o. female with medical history significant for diabetes, hypertension, hyperlipidemia presented to the emergency department with the chief complaint of left-sided weakness. MRI revealed acute infarct R anterior pons.   Assessment / Plan / Recommendation Clinical Impression  Pt presents with normal speech, language, and cognition s/p pontine stroke.  No SLP f/u is warranted - our services will sign off.     SLP Assessment  SLP Recommendation/Assessment: Patient does not need any further Speech Lanaguage Pathology Services    Follow Up Recommendations  None    Frequency and Duration           SLP Evaluation Cognition  Overall Cognitive Status: Within  Functional Limits for tasks assessed Arousal/Alertness: Awake/alert Orientation Level: Oriented X4 Attention: Selective Selective Attention: Appears intact Memory: Appears intact Awareness: Appears intact Problem Solving: Appears intact Safety/Judgment: Appears intact       Comprehension  Auditory Comprehension Overall Auditory Comprehension: Appears within functional limits for tasks assessed Visual Recognition/Discrimination Discrimination: Within Function Limits Reading Comprehension Reading Status: Within funtional limits    Expression Expression Primary Mode of Expression: Verbal Verbal Expression Overall Verbal Expression: Appears within functional limits for tasks assessed Written Expression Dominant Hand: Right   Oral / Motor  Oral Motor/Sensory Function Overall Oral Motor/Sensory Function: Within functional limits Motor Speech Overall Motor Speech: Appears within functional limits for tasks assessed   GO          Functional Assessment Tool Used: clinical judgment Functional Limitations: Spoken language expressive Spoken Language Expression Current Status (W2637): 0 percent impaired, limited or restricted Spoken Language Expression Goal Status (C5885): 0 percent impaired, limited or restricted Spoken Language Expression Discharge Status [phone]): 0 percent impaired, limited or restricted         Juan Quam [place]ice 08/25/2016, 11:52 AM

## 2016-08-25 NOTE — Evaluation (Addendum)
Occupational Therapy Evaluation Patient Details Name: Jenny Kelley MRN: [id number] DOB: 1950-05-25 Today's Date: 08/25/2016    History of Present Illness Pt is a 66 y.o. female with medical history significant for diabetes, hypertension, hyperlipidemia presented to the emergency department with the chief complaint of left-sided weakness. MRI revealed acute infarct R anterior pons.   Clinical Impression   PTA, pt was independent with ADL and functional mobility and was working as a Psychiatric nurse. She currently requires min assist overall for ADL performance to ensure safety. She presents with decreased sensation, strength, and coordination in her L UE impacting her ability to participate with ADL tasks at [facility]. Feel pt would benefit from home health OT services post-acute D/C as well as 24 hour assistance from family. She would benefit from continued OT services while admitted to improve independence and safety with ADL tasks.     Follow Up Recommendations  Home health OT;Supervision/Assistance - 24 hour    Equipment Recommendations  Other (comment) (TBD)    Recommendations for Other Services Rehab consult     Precautions / Restrictions Precautions Precautions: Fall Restrictions Weight Bearing Restrictions: No      Mobility Bed Mobility Overal bed mobility: Needs Assistance Bed Mobility: Sit to Supine     Supine to sit: Min assist Sit to supine: Supervision   General bed mobility comments: up in chair initially, assist to supine for echo test after session; assist for positioning  Transfers Overall transfer level: Needs assistance Equipment used: 1 person hand held assist Transfers: Sit to/from Stand Sit to Stand: Supervision         General transfer comment: UE support on arms of recliner    Balance Overall balance assessment: Needs assistance Sitting-balance support: No upper extremity supported;Feet supported Sitting balance-Leahy Scale: Good     Standing balance  support: Bilateral upper extremity supported;Single extremity supported;During functional activity Standing balance-Leahy Scale: Fair                             ADL either performed or assessed with clinical judgement   ADL Overall ADL's : Needs assistance/impaired Eating/Feeding: Sitting;Minimal assistance   Grooming: Minimal assistance;Standing Grooming Details (indicate cue type and reason): Assist for opening containers Upper Body Bathing: Min guard;Sitting   Lower Body Bathing: Minimal assistance;Sit to/from stand   Upper Body Dressing : Min guard;Sitting   Lower Body Dressing: Minimal assistance;Sit to/from stand   Toilet Transfer: Minimal assistance;Ambulation;BSC   Toileting- Clothing Manipulation and Hygiene: Minimal assistance;Sit to/from stand       Functional mobility during ADLs: Minimal assistance General ADL Comments: Pt with L sided weakness requiring handheld assistance or UE support for standing tasks.      Vision Baseline Vision/History: Wears glasses Wears Glasses: Reading only Vision Assessment?: Yes Eye Alignment: Within Functional Limits Ocular Range of Motion: Within Functional Limits Alignment/Gaze Preference: Within Defined Limits Tracking/Visual Pursuits: Able to track stimulus in all quads without difficulty Saccades: Within functional limits Convergence: Within functional limits Visual Fields: No apparent deficits Additional Comments: No visual deficits noted during functional tasks.      Perception     Praxis Praxis Praxis tested?: Within functional limits    Pertinent Vitals/Pain Pain Assessment: No/denies pain Pain Score: 10-Worst pain ever Pain Location: L side Pain Descriptors / Indicators: Aching;Sore Pain Intervention(s): Limited activity within patient's tolerance;Monitored during session;Repositioned     Hand Dominance Right   Extremity/Trunk Assessment Upper Extremity Assessment Upper Extremity Assessment:  Defer  to OT evaluation LUE Deficits / Details: Pt with 3+/5 strength grossly in L UE. Decreased fine and gross motor coordination noted.  LUE Sensation: decreased light touch;decreased proprioception LUE Coordination: decreased fine motor;decreased gross motor   Lower Extremity Assessment Lower Extremity Assessment: LLE deficits/detail LLE Deficits / Details: AROM WFL, strength Hip flexion 2+/5, knee extension 4-/5, ankle DF 3+/5       Communication Communication Communication: No difficulties   Cognition Arousal/Alertness: Awake/alert Behavior During Therapy: WFL for tasks assessed/performed Overall Cognitive Status: Within Functional Limits for tasks assessed                                     General Comments  Educated in fall prevention including lighting, footwear, nonslip mat in and out of tub, use of AD, placement of items within reach and how to have spouse assist on stairs    Exercises     Shoulder Instructions      Home Living Family/patient expects to be discharged to:: Private residence Living Arrangements: Spouse/significant other Available Help at Discharge: Family;Available 24 hours/day Type of Home: House Home Access: Stairs to enter [employer] of Steps: 4 Entrance Stairs-Rails: Right Home Layout: One level     Bathroom Shower/Tub: Tub only (garden tub)   Bathroom Toilet: Handicapped height     Home Equipment: [place] - single point;Grab bars - toilet;Grab bars - tub/shower          Prior Functioning/Environment Level of Independence: Independent        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 Problem List: Decreased strength;Decreased range of motion;Decreased activity tolerance;Impaired balance (sitting and/or standing);Decreased safety awareness;Decreased knowledge of use of DME or AE;Decreased knowledge of precautions;Impaired UE functional use      OT Treatment/Interventions: Self-care/ADL  training;Therapeutic exercise;Energy conservation;DME and/or AE instruction;Therapeutic activities;Patient/family education;Balance training    OT Goals(Current goals can be found in the care plan section) Acute Rehab OT Goals Patient Stated Goal: to get back to work OT Goal Formulation: With patient Time For Goal Achievement: 09/08/16 Potential to Achieve Goals: Good ADL Goals Pt Will Perform Grooming: with supervision;standing (including gathering items) Pt Will Perform Upper Body Dressing: with modified independence;sitting Pt Will Perform Lower Body Dressing: with supervision;sit to/from stand Pt Will Transfer to Toilet: with supervision;ambulating (comfort height toilet) Pt Will Perform Toileting - Clothing Manipulation and hygiene: with supervision;sit to/from stand Pt Will Perform Tub/Shower Transfer: with supervision;ambulating;Tub transfer;3 in 1;grab bars Pt/caregiver will Perform Home Exercise Program: Left upper extremity;With written HEP provided;Independently;Increased strength  OT Frequency: Min 3X/week   Barriers to D/C:            Co-evaluation              AM-PAC PT "6 Clicks" Daily Activity     Outcome Measure Help from another person eating meals?: A Little Help from another person taking care of personal grooming?: A Little Help from another person toileting, which includes using toliet, bedpan, or urinal?: A Little Help from another person bathing (including washing, rinsing, drying)?: A Little Help from another person to put on and taking off regular upper body clothing?: A Little Help from another person to put on and taking off regular lower body clothing?: A Little 6 Click Score: 18   End of Session Equipment Utilized During Treatment: Gait belt Nurse Communication: Mobility status  Activity Tolerance: Patient tolerated  treatment well Patient left: in chair;with call bell/phone within reach  OT Visit Diagnosis: Hemiplegia and  hemiparesis Hemiplegia - Right/Left: Left Hemiplegia - dominant/non-dominant: Non-Dominant Hemiplegia - caused by: Cerebral infarction                Time: 4481-8563 OT Time Calculation (min): 21 min Charges:  OT General Charges $OT Visit: 1 Procedure OT Evaluation $OT Eval Moderate Complexity: 1 Procedure G-Codes: OT G-codes **NOT FOR INPATIENT CLASS** Functional Assessment Tool Used: Clinical judgement Functional Limitation: Self care Self Care Current Status (J4970): At least 20 percent but less than 40 percent impaired, limited or restricted Self Care Goal Status (Y6378): At least 1 percent but less than 20 percent impaired, limited or restricted   Norman Herrlich, [place] OTR/L  Pager: [place] 08/25/2016, 10:39 AM

## 2016-08-25 NOTE — Progress Notes (Signed)
D/c'd tele, D/c'd IV. Currently awaiting pt.'s husbands arrival. Pt. Having issues reaching husband. Number on chart incorrect. Pt. Able to reach [place], her daughter who states she will call patient's husband. Await his arrival so that patient can be discharged.

## 2016-08-25 NOTE — Care Management Note (Signed)
Case Management Note  Patient Details  Name: Jenny Kelley MRN: [id number] Date of Birth: Apr 24, 1950  Subjective/Objective:                    Action/Plan: Pt discharging home with orders for [facility] services. CM met with the patient and provided her a list of [facility] agencies. She selected [place]. [name] with [facility] notified and accepted the referral.  Pt has used walker in the hospital. Pt states she has one at home to use. Pt states her husband in going to provide transportation home.   Expected Discharge Date:  08/25/16               Expected Discharge Plan:  [place]  In-House Referral:     Discharge planning Services  CM Consult  Post Acute Care Choice:  Home Health Choice offered to:  Patient  DME Arranged:    DME Agency:     HH Arranged:  PT, OT HH Agency:  [place]  Status of Service:  Completed, signed off  If discussed at [place] of Stay Meetings, dates discussed:    Additional Comments:  Pollie Friar, RN 08/25/2016, 2:49 PM

## 2016-08-25 NOTE — Care Management Obs Status (Signed)
[place] NOTIFICATION   Patient Details  Name: Jenny Kelley MRN: [id number] Date of Birth: December 13, 1950   Medicare Observation Status Notification Given:  Yes    Pollie Friar, RN 08/25/2016, 3:11 PM

## 2016-08-25 NOTE — Progress Notes (Signed)
STROKE TEAM PROGRESS NOTE   HISTORY OF PRESENT ILLNESS (per record) Jenny Kelley is an 66 y.o. female with a history of anemia, colon cancer, headache, hypertension, hyperlipidemia, and type II diabetes mellitus, who per her husband had been complaining of left-sided weakness and chest pain throughout the day yesterday and this morning. At approximately 3 AM on 08/24/2016 the patient was awoken by her sleep and felt as though although her chest pain continued her left-sided weakness was worsened. Patient try to get out of bed and was unable to. Patient was also complaining of some left arm pain that radiated from shoulder down. Patient was brought to the fire department where she was then brought in via ambulance. Patient was called as a code stroke once in the ED. Upon arrival patient still had mild left arm and leg weakness. CT of head was obtained showing no acute stroke. CTA of head and neck were then also obtained. Patient states she does take aspirin, she is a diabetic, she does have hyperlipidemia, and hypertension.  Date last known well: Date: 08/24/2016 Time last known well: Time: 03:00  Patient was not administered IV t-PA secondary to minimal symptoms on arrival/presenting outside of the treatment window. She was admitted to General Neurology for further evaluation and treatment.   SUBJECTIVE (INTERVAL HISTORY) She is alone in the room.  The patient is awake, alert, and follows all commands appropriately. Still has mild left side hemiparesis but doing well with PT/OT.    OBJECTIVE Temp:  [97.8 F (36.6 C)-98.8 F (37.1 C)] 98 F (36.7 C) (07/31 0556) Pulse Rate:  [50-69] 69 (07/31 0556) Cardiac Rhythm: Normal sinus rhythm (07/31 0700) Resp:  [13-24] 18 (07/31 0556) BP: (164-201)/(67-95) 175/76 (07/31 0556) SpO2:  [97 %-100 %] 100 % (07/31 0556) Weight:  [91.7 kg (202 lb 2.6 oz)] 91.7 kg (202 lb 2.6 oz) (07/30 0921)  CBC:  Recent Labs Lab 08/24/16 0831 08/24/16 0843  WBC  6.1  --   NEUTROABS 3.5  --   HGB 10.9* 12.6  HCT 34.7* 37.0  MCV 92.5  --   PLT 278  --     Basic Metabolic Panel:  Recent Labs Lab 08/24/16 0831 08/24/16 0843  NA 140 142  K 3.9 3.9  CL 106 104  CO2 27  --   GLUCOSE 218* 216*  BUN 13 16  CREATININE 0.76 0.60  CALCIUM 9.8  --     Lipid Panel:    Component Value Date/Time   CHOL 190 08/24/2016 0939   TRIG 121 08/24/2016 0939   HDL 67 08/24/2016 0939   CHOLHDL 2.8 08/24/2016 0939   VLDL 24 08/24/2016 0939   LDLCALC 99 08/24/2016 0939   HgbA1c:  Lab Results  Component Value Date   HGBA1C 6.7 (H) 08/24/2016   Urine Drug Screen: No results found for: LABOPIA, COCAINSCRNUR, LABBENZ, AMPHETMU, THCU, LABBARB  Alcohol Level No results found for: [place] I have personally reviewed the radiological images below and agree with the radiology interpretations.  Ct Angio Head W Or Wo Contrast Ct Angio Neck W Or Wo Contrast 08/24/2016 IMPRESSION: 1. Negative for emergent large vessel occlusion. 2. High-grade proximal right M2 branch stenosis. 3. Moderate atherosclerotic plaque in the cervical carotids without flow limiting stenosis or ulceration. 4. High-grade atheromatous narrowing of the right V1 segment.   Mr Brain Wo Contrast 08/24/2016 IMPRESSION: Acute infarct right anterior pons. Mild chronic microvascular ischemia in the cerebral white matter.   Ct Head Code Stroke W/o  Cm 08/24/2016 IMPRESSION: 1. No acute finding. ASPECTS is 10.  2D Echo 08/25/2016 Study Conclusions - Left ventricle: The cavity size was normal. There was mild concentric hypertrophy. Systolic function was normal. The estimated ejection fraction was in the range of 60% to 65%. Wall motion was normal; there were no regional wall motion abnormalities. Doppler parameters are consistent with abnormal left ventricular relaxation (grade 1 diastolic dysfunction). EF 60-65%. No source of embolus   PHYSICAL EXAM  Temp:  [98 F (36.7 C)-98.6 F (37  C)] 98.4 F (36.9 C) (07/31 1454) Pulse Rate:  [66-83] 73 (07/31 1454) Resp:  [18-19] 19 (07/31 1454) BP: (163-177)/(68-87) 163/78 (07/31 1454) SpO2:  [100 %] 100 % (07/31 1454)  General - Well nourished, well developed, in no apparent distress.  Ophthalmologic - Sharp disc margins OU.   Cardiovascular - Regular rate and rhythm.  Mental Status -  Level of arousal and orientation to time, place, and person were intact. Language including expression, naming, repetition, comprehension was assessed and found intact. Fund of Knowledge was assessed and was intact.  Cranial Nerves II - XII - II - Visual field intact OU. III, IV, VI - Extraocular movements intact. V - Facial sensation intact bilaterally. VII - Facial movement intact bilaterally. VIII - Hearing & vestibular intact bilaterally. X - Palate elevates symmetrically. XI - Chin turning & shoulder shrug intact bilaterally. XII - Tongue protrusion intact.  Motor Strength - The patient's strength was normal in RUE and RLE, but LUE and LLE 4+/5 with left hand dexterity difficulty and pronator drift was present on the left.  Bulk was normal and fasciculations were absent.   Motor Tone - Muscle tone was assessed at the neck and appendages and was normal.  Reflexes - The patient's reflexes were 1+ in all extremities and she had no pathological reflexes.  Sensory - Light touch, temperature/pinprick were assessed and were symmetrical.    Coordination - The patient had normal movements in the hands with no ataxia or dysmetria.  Tremor was absent.  Gait and Station - deferred   ASSESSMENT/PLAN Jenny Kelley is a 66 y.o. female with history ofanemia, colon cancer, headache, hypertension, hyperlipidemia, and type II diabetes mellitus, who per her husband had been complaining of left-sided weakness and chest pain.  She did not receive IV t-PA due to arriving outside of the treatment window and minimal symptoms on arrival.  Stroke:  Acute infarct right anterior pons, likely small vessel disease  Resultant  Left mild hemiparesis  CT head: no stroke  MRI head: Acute infarct right anterior pons.   CTA head and neck - right M2 and V1 stenosis, left ICA atherosclerosis  2D Echo: EF 60-65%. No source of embolus  UDS negative  LDL 99  HgbA1c 6.7  SCDs for VTE prophylaxis  Diet heart healthy/carb modified Room service appropriate? Yes; Fluid consistency: Thin  aspirin 81 mg daily prior to admission, now on clopidogrel 75 mg daily. Continue plavix on discharge.  Patient counseled to be compliant with her antithrombotic medications  Ongoing aggressive stroke risk factor management  Therapy recommendations: Home health OT;Supervision/Assistance - 24 hour   Disposition:  pending  Hypertension  Stable  Permissive hypertension (OK if < 220/120) but gradually normalize in 5-7 days  Long-term BP goal normotensive  Hyperlipidemia  Home meds: atorvastatin 40mg  PO, reordered in hospital  LDL 99, goal < 70  Continue statin at discharge  Diabetes  HgbA1c 6.7, goal < 7.0  Uncontrolled  SSI  Other  Stroke Risk Factors  Advanced age  History of cancer  Other Active Problems  Low B12 level - [facility] day # 0  Neurology will sign off. Please call with questions. Pt will follow up with Cecille Rubin, NP, at [facility] in about 6 weeks. Thanks for the consult.   Rosalin Hawking, MD PhD Stroke Neurology 08/25/2016 11:16 PM    To contact Stroke Continuity provider, please refer to [email or web address] After hours, contact General Neurology

## 2016-08-25 NOTE — Evaluation (Signed)
Physical Therapy Evaluation Patient Details Name: Jenny Kelley MRN: [id number] DOB: 03-15-1950 Today's Date: 08/25/2016   History of Present Illness  Pt is a 66 y.o. female with medical history significant for diabetes, hypertension, hyperlipidemia presented to the emergency department with the chief complaint of left-sided weakness. MRI revealed acute infarct R anterior pons.  Clinical Impression  Patient presents with decreased independence due to L sided weakness.  Seems to have intact sensation and compensating well with RW.  Patient for d/c home today.  Recommend follow up HHPT and will have spouse assist.  Educated in fall prevention and safety.  Will defer further skilled PT to [facility] setting.     Follow Up Recommendations Home health PT;Supervision/Assistance - 24 hour    Equipment Recommendations  None recommended by PT    Recommendations for Other Services       Precautions / Restrictions Precautions Precautions: Fall Restrictions Weight Bearing Restrictions: No      Mobility  Bed Mobility Overal bed mobility: Needs Assistance Bed Mobility: Sit to Supine     Supine to sit: Min assist Sit to supine: Supervision   General bed mobility comments: up in chair initially, assist to supine for echo test after session; assist for positioning  Transfers Overall transfer level: Needs assistance Equipment used: 1 person hand held assist Transfers: Sit to/from Stand Sit to Stand: Supervision         General transfer comment: UE support on arms of recliner  Ambulation/Gait Ambulation/Gait assistance: Min guard Ambulation Distance (Feet): 250 Feet Assistive device: Rolling walker (2 wheeled) Gait Pattern/deviations: Step-through pattern;Decreased stride length;Decreased dorsiflexion - left;Trendelenburg     General Gait Details: decreased foot clearance on L and hip instability noted, educated on risk for foot drag with fatigue and fall risk  Stairs Stairs:  Yes Stairs assistance: Min assist Stair Management: One rail Right;Step to pattern;Forwards (and hand held assist) Number of Stairs: 4 General stair comments: cues for technique, assist and walker placement  Wheelchair Mobility    Modified Rankin (Stroke Patients Only) Modified Rankin (Stroke Patients Only) Pre-Morbid Rankin Score: No symptoms Modified Rankin: Moderately severe disability     Balance Overall balance assessment: Needs assistance Sitting-balance support: No upper extremity supported;Feet supported Sitting balance-Leahy Scale: Good     Standing balance support: Bilateral upper extremity supported;Single extremity supported;During functional activity Standing balance-Leahy Scale: Fair                               Pertinent Vitals/Pain Pain Assessment: No/denies pain Pain Score: 10-Worst pain ever Pain Location: L side Pain Descriptors / Indicators: Aching;Sore Pain Intervention(s): Limited activity within patient's tolerance;Monitored during session;Repositioned    Home Living Family/patient expects to be discharged to:: Private residence Living Arrangements: Spouse/significant other Available Help at Discharge: Family;Available 24 hours/day Type of Home: House Home Access: Stairs to enter Entrance Stairs-Rails: Right Entrance Stairs-Number of Steps: 4 Home Layout: One level Home Equipment: Cane - single point;Grab bars - toilet;Grab bars - tub/shower      Prior Function Level of Independence: Independent         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 Decreased fine and gross motor coordination noted.  LUE Sensation: decreased light touch;decreased  proprioception LUE Coordination: decreased fine motor;decreased  gross motor    Lower Extremity Assessment Lower Extremity Assessment: LLE deficits/detail LLE Deficits / Details: AROM WFL, strength Hip flexion 2+/5, knee extension 4-/5, ankle DF 3+/5       Communication   Communication: No difficulties  Cognition Arousal/Alertness: Awake/alert Behavior During Therapy: WFL for tasks assessed/performed Overall Cognitive Status: Within Functional Limits for tasks assessed                                        General Comments General comments (skin integrity, edema, etc.): Educated in fall prevention including lighting, footwear, nonslip mat in and out of tub, use of AD, placement of items within reach and how to have spouse assist on stairs    Exercises     Assessment/Plan    PT Assessment All further PT needs can be met in the next venue of care  PT Problem List Decreased strength;Decreased mobility;Decreased balance;Decreased knowledge of use of DME;Decreased safety awareness       PT Treatment Interventions      PT Goals (Current goals can be found in the Care Plan section)  Acute Rehab PT Goals Patient Stated Goal: to get back to work PT Goal Formulation: All assessment and education complete, DC therapy    Frequency     Barriers to discharge        Co-evaluation               AM-PAC PT "6 Clicks" Daily Activity  Outcome Measure Difficulty turning over in bed (including adjusting bedclothes, sheets and blankets)?: None Difficulty moving from lying on back to sitting on the side of the bed? : A Little Difficulty sitting down on and standing up from a chair with arms (e.g., wheelchair, bedside commode, etc,.)?: Total Help needed moving to and from a bed to chair (including a wheelchair)?: A Little Help needed walking in hospital room?: A Little Help needed climbing 3-5 steps with a railing? : A Little 6 Click Score: 17    End of Session Equipment Utilized During Treatment: Gait belt Activity  Tolerance: Patient tolerated treatment well Patient left: in bed;with call bell/phone within reach   PT Visit Diagnosis: Other abnormalities of gait and mobility (R26.89);Hemiplegia and hemiparesis Hemiplegia - Right/Left: Left Hemiplegia - dominant/non-dominant: Non-dominant Hemiplegia - caused by: Cerebral infarction    Time: 9449-6759 PT Time Calculation (min) (ACUTE ONLY): 23 min   Charges:   PT Evaluation $PT Eval Moderate Complexity: 1 Mod PT Treatments $Gait Training: 8-22 mins   PT G Codes:   PT G-Codes **NOT FOR INPATIENT CLASS** Functional Assessment Tool Used: AM-PAC 6 Clicks Basic Mobility Functional Limitation: Mobility: Walking and moving around Mobility: Walking and Moving Around Current Status (F6384): At least 40 percent but less than 60 percent impaired, limited or restricted Mobility: Walking and Moving Around Goal Status [phone]): At least 40 percent but less than 60 percent impaired, limited or restricted Mobility: Walking and Moving Around Discharge Status [phone]): At least 40 percent but less than 60 percent impaired, limited or restricted    [place], [place] 08/25/2016   Reginia Naas 08/25/2016, 10:30 AM

## 2016-08-25 NOTE — Progress Notes (Signed)
Rehab Admissions Coordinator Note:  Patient was screened by Retta Diones for appropriateness for an Inpatient Acute Rehab Consult.  At this time, we are recommending HH.  If patient does not progress, then can consider inpatient rehab consult.  Retta Diones 08/25/2016, 9:37 AM  I can be reached at [phone].

## 2016-08-25 NOTE — Progress Notes (Signed)
  Echocardiogram 2D Echocardiogram has been performed.  Merikay Lesniewski G Salli Bodin 08/25/2016, 11:14 AM

## 2016-08-25 NOTE — Discharge Summary (Signed)
Physician Discharge Summary  Jenny Kelley KNL:[id number] DOB: [birth year] DOA: 08/24/2016  PCP: Benito Mccreedy, MD  Admit date: 08/24/2016 Discharge date: 08/25/2016  Admitted From: home  Disposition:  home   Recommendations for Outpatient Follow-up:  1. F/u cholesterol in 1-3 months  Home Health:  PT/OT  Equipment/Devices:  walker    Discharge Condition:  stable   CODE STATUS:  Full code   Consultations:  neuro    Discharge Diagnoses:  Principal Problem:   Cerebral embolism with cerebral infarction Active Problems:   Left-sided weakness   Diabetes mellitus without complication (HCC)   HTN (hypertension)   Hyperlipidemia      Subjective: Feels that right arm and leg strength is normalized. Now new symptoms. I walked with her into the hall and she became a bit unsteady. No c/o dizziness or visual changes.   Brief Summary: Jenny Kelley is a delightful 66 y.o. female with medical history significant for diabetes, hypertension, hyperlipidemia presents to the emergency department with the chief complaint of left-sided weakness. MRI showed acute infarct right anterior pons. She was outside the window for TPA.   Hospital Course:   Acute CVA - left arm and leg weakness resolving but unsteady on ambulation- >> will order HHPT and OT - she has a walker at home - CTA obtained - prox R M2 and V1 stenosis- see results below - 2 D ECHO does not show a thrombus - LDL 99 - A1c 6.7 - change aspirin 81 mg to Plavix and start high dose Lipitor  DM2 - cont Metformin and diet control  HTN - Ramipril  HLD - cont statin   Discharge Instructions  Discharge Instructions    Diet - low sodium heart healthy    Complete by:  As directed    Diet - low sodium heart healthy    Complete by:  As directed    Diet Carb Modified    Complete by:  As directed    Diet Carb Modified    Complete by:  As directed    Increase activity slowly    Complete by:  As directed    Increase  activity slowly    Complete by:  As directed      Allergies as of 08/25/2016   No Known Allergies     Medication List    STOP taking these medications   aspirin 81 MG chewable tablet   pravastatin 20 MG tablet Commonly known as:  PRAVACHOL     TAKE these medications   atorvastatin 40 MG tablet Commonly known as:  LIPITOR Take 2 tablets (80 mg total) by mouth daily. What changed:  how much to take   cholecalciferol 1000 units tablet Commonly known as:  VITAMIN D Take 1,000 Units by mouth daily.   clopidogrel 75 MG tablet Commonly known as:  PLAVIX Take 1 tablet (75 mg total) by mouth daily.   gabapentin 100 MG capsule Commonly known as:  NEURONTIN Take 200 mg by mouth at bedtime.   metFORMIN 1000 MG tablet Commonly known as:  GLUCOPHAGE Take 1,000 mg by mouth 2 (two) times daily with a meal.   NATURAL BALANCE OP Place 1 drop into both eyes daily as needed. Runny eyes   ramipril 10 MG capsule Commonly known as:  ALTACE Take 10 mg by mouth daily.       No Known Allergies   Procedures/Studies: 2 D ECHO Left ventricle: The cavity size was normal. There was mild   concentric hypertrophy. Systolic  function was normal. The   estimated ejection fraction was in the range of 60% to 65%. Wall   motion was normal; there were no regional wall motion   abnormalities. Doppler parameters are consistent with abnormal   left ventricular relaxation (grade 1 diastolic dysfunction).  Ct Angio Head W Or Wo Contrast  Result Date: 08/24/2016 CLINICAL DATA:  Code stroke.  Left-sided weakness since 3:30 a.m. EXAM: CT ANGIOGRAPHY HEAD AND NECK TECHNIQUE: Multidetector CT imaging of the head and neck was performed using the standard protocol during bolus administration of intravenous contrast. Multiplanar CT image reconstructions and MIPs were obtained to evaluate the vascular anatomy. Carotid stenosis measurements (when applicable) are obtained utilizing NASCET criteria, using the  distal internal carotid diameter as the denominator. CONTRAST:  50 cc Isovue 370 intravenous COMPARISON:  Head CT from earlier today FINDINGS: CTA NECK FINDINGS Aortic arch: Atherosclerotic plaque without acute finding or dilatation. Two vessel branching pattern Right carotid system: Moderate predominately noncalcified plaque of the proximal ICA without flow limiting stenosis, ulceration, or dissection. Left carotid system: Atheromatous thickening of the common carotid wall. Moderate calcified and noncalcified plaque at the ICA bulb. No flow limiting stenosis, ulceration, or dissection. Vertebral arteries: Proximal subclavian atherosclerosis without flow limiting stenosis. There is high-grade narrowing of the right vertebral artery due to calcified and noncalcified plaque. No indication of dissection. Skeleton: No acute or aggressive finding Other neck: Negative Upper chest: No acute finding Review of the MIP images confirms the above findings CTA HEAD FINDINGS Anterior circulation: Mild calcified plaque on the carotid siphons. There is advanced wasting of the proximal right M2 superior division branch. No branch occlusion or central thrombus appearance. Probable mild atherosclerotic narrowing of the left M1 segment and a proximal M2 branch. No generalized vessel beading. Negative for aneurysm. Posterior circulation: Right dominant. Much of the left vertebral artery flow is into the PICA hypoplastic right P1 segment. There is a dominant right AICA. Vertebral, basilar, and posterior cerebral arteries are smooth and widely patent. Venous sinuses: Patent Anatomic variants: None other than described above. Delayed phase: Not performed in the emergent setting. A page was placed 08/24/2016  at 9:25 am to Dr. Samara Snide . Review of the MIP images confirms the above findings IMPRESSION: 1. Negative for emergent large vessel occlusion. 2. High-grade proximal right M2 branch stenosis. 3. Moderate atherosclerotic plaque in  the cervical carotids without flow limiting stenosis or ulceration. 4. High-grade atheromatous narrowing of the right V1 segment. Electronically Signed   By: Monte Fantasia M.D.   On: 08/24/2016 09:30   Ct Angio Neck W Or Wo Contrast  Result Date: 08/24/2016 CLINICAL DATA:  Code stroke.  Left-sided weakness since 3:30 a.m. EXAM: CT ANGIOGRAPHY HEAD AND NECK TECHNIQUE: Multidetector CT imaging of the head and neck was performed using the standard protocol during bolus administration of intravenous contrast. Multiplanar CT image reconstructions and MIPs were obtained to evaluate the vascular anatomy. Carotid stenosis measurements (when applicable) are obtained utilizing NASCET criteria, using the distal internal carotid diameter as the denominator. CONTRAST:  50 cc Isovue 370 intravenous COMPARISON:  Head CT from earlier today FINDINGS: CTA NECK FINDINGS Aortic arch: Atherosclerotic plaque without acute finding or dilatation. Two vessel branching pattern Right carotid system: Moderate predominately noncalcified plaque of the proximal ICA without flow limiting stenosis, ulceration, or dissection. Left carotid system: Atheromatous thickening of the common carotid wall. Moderate calcified and noncalcified plaque at the ICA bulb. No flow limiting stenosis, ulceration, or dissection. Vertebral arteries: Proximal subclavian  atherosclerosis without flow limiting stenosis. There is high-grade narrowing of the right vertebral artery due to calcified and noncalcified plaque. No indication of dissection. Skeleton: No acute or aggressive finding Other neck: Negative Upper chest: No acute finding Review of the MIP images confirms the above findings CTA HEAD FINDINGS Anterior circulation: Mild calcified plaque on the carotid siphons. There is advanced wasting of the proximal right M2 superior division branch. No branch occlusion or central thrombus appearance. Probable mild atherosclerotic narrowing of the left M1 segment and  a proximal M2 branch. No generalized vessel beading. Negative for aneurysm. Posterior circulation: Right dominant. Much of the left vertebral artery flow is into the PICA hypoplastic right P1 segment. There is a dominant right AICA. Vertebral, basilar, and posterior cerebral arteries are smooth and widely patent. Venous sinuses: Patent Anatomic variants: None other than described above. Delayed phase: Not performed in the emergent setting. A page was placed 08/24/2016  at 9:25 am to Dr. Samara Snide . Review of the MIP images confirms the above findings IMPRESSION: 1. Negative for emergent large vessel occlusion. 2. High-grade proximal right M2 branch stenosis. 3. Moderate atherosclerotic plaque in the cervical carotids without flow limiting stenosis or ulceration. 4. High-grade atheromatous narrowing of the right V1 segment. Electronically Signed   By: Monte Fantasia M.D.   On: 08/24/2016 09:30   Mr Brain Wo Contrast  Result Date: 08/24/2016 CLINICAL DATA:  Left-sided weakness. Hypertension diabetes hyperlipidemia EXAM: MRI HEAD WITHOUT CONTRAST TECHNIQUE: Multiplanar, multiecho pulse sequences of the brain and surrounding structures were obtained without intravenous contrast. COMPARISON:  CT head 08/24/2016 FINDINGS: Brain: Acute infarct in the right anterior lower pons measuring 1 cm. No other acute infarct. Ventricle size normal. Mild chronic ischemic change in the white matter. Negative for hemorrhage or mass. Vascular: Normal arterial flow voids. Skull and upper cervical spine: Negative Sinuses/Orbits: Negative Other: None IMPRESSION: Acute infarct right anterior pons. Mild chronic microvascular ischemia in the cerebral white matter. Electronically Signed   By: Franchot Gallo M.D.   On: 08/24/2016 17:02   Ct Head Code Stroke W/o Cm  Result Date: 08/24/2016 CLINICAL DATA:  Code stroke. Left-sided weakness and upper chest pain. EXAM: CT HEAD WITHOUT CONTRAST TECHNIQUE: Contiguous axial images were  obtained from the base of the skull through the vertex without intravenous contrast. COMPARISON:  None. FINDINGS: Brain: No evidence of acute infarction, hemorrhage, hydrocephalus, extra-axial collection or mass lesion/mass effect. Mild low-density in the cerebral white matter, predominately periventricular, usually from chronic small vessel ischemia. Vascular: No hyperdense vessel. Skull: No acute finding Sinuses/Orbits: Bilateral cataract resection.  No acute finding. Other:Text page with prelim sent on 08/24/2016 at 8:47 am to Dr. Lorraine Lax. ASPECTS [facility] Stroke Program Early CT Score) - Ganglionic level infarction (caudate, lentiform nuclei, internal capsule, insula, M1-M3 cortex): 7 - Supraganglionic infarction (M4-M6 cortex): 3 Total score (0-10 with 10 being normal): 10 IMPRESSION: 1. No acute finding. ASPECTS is 10. 2. Mild white matter disease. Electronically Signed   By: Monte Fantasia M.D.   On: 08/24/2016 08:49       Discharge Exam: Vitals:   08/25/16 0556 08/25/16 0916  BP: (!) 175/76 (!) 177/87  Pulse: 69 83  Resp: 18 19  Temp: 98 F (36.7 C) 98.6 F (37 C)   Vitals:   08/24/16 2100 08/25/16 0040 08/25/16 0556 08/25/16 0916  BP: (!) 186/77 (!) 177/68 (!) 175/76 (!) 177/87  Pulse: 60 66 69 83  Resp: 18 18 18 19   Temp: 98.8 F (37.1 C)  98.5 F (36.9 C) 98 F (36.7 C) 98.6 F (37 C)  TempSrc: Oral Oral Oral Oral  SpO2: 100% 100% 100% 100%  Weight:        General: Pt is alert, awake, not in acute distress Cardiovascular: RRR, S1/S2 +, no rubs, no gallops Respiratory: CTA bilaterally, no wheezing, no rhonchi Abdominal: Soft, NT, ND, bowel sounds + Extremities: no edema, no cyanosis    The results of significant diagnostics from this hospitalization (including imaging, microbiology, ancillary and laboratory) are listed below for reference.     Microbiology: No results found for this or any previous visit (from the past 240 hour(s)).   Labs: BNP (last 3 results) No  results for input(s): BNP in the last 8760 hours. Basic Metabolic Panel:  Recent Labs Lab 08/24/16 0831 08/24/16 0843  NA 140 142  K 3.9 3.9  CL 106 104  CO2 27  --   GLUCOSE 218* 216*  BUN 13 16  CREATININE 0.76 0.60  CALCIUM 9.8  --    Liver Function Tests:  Recent Labs Lab 08/24/16 0831  AST 24  ALT 18  ALKPHOS 64  BILITOT 0.4  PROT 7.1  ALBUMIN 4.2   No results for input(s): LIPASE, AMYLASE in the last 168 hours. No results for input(s): AMMONIA in the last 168 hours. CBC:  Recent Labs Lab 08/24/16 0831 08/24/16 0843  WBC 6.1  --   NEUTROABS 3.5  --   HGB 10.9* 12.6  HCT 34.7* 37.0  MCV 92.5  --   PLT 278  --    Cardiac Enzymes: No results for input(s): CKTOTAL, CKMB, CKMBINDEX, TROPONINI in the last 168 hours. BNP: Invalid input(s): POCBNP CBG:  Recent Labs Lab 08/24/16 0831 08/24/16 1745 08/24/16 2115 08/25/16 0629 08/25/16 1115  GLUCAP 192* 170* 180* 168* 227*   D-Dimer No results for input(s): DDIMER in the last 72 hours. Hgb A1c  Recent Labs  08/24/16 0938  HGBA1C 6.7*   Lipid Profile  Recent Labs  08/24/16 0939  CHOL 190  HDL 67  LDLCALC 99  TRIG 121  CHOLHDL 2.8   Thyroid function studies No results for input(s): TSH, T4TOTAL, T3FREE, THYROIDAB in the last 72 hours.  Invalid input(s): FREET3 Anemia work up  Recent Labs  08/24/16 1557  VITAMINB12 287  FOLATE 16.3  FERRITIN 31  TIBC 367  IRON 77  RETICCTPCT 1.8   Urinalysis    Component Value Date/Time   COLORURINE YELLOW 08/24/2016 1754   APPEARANCEUR HAZY (A) 08/24/2016 1754   LABSPEC 1.023 08/24/2016 1754   PHURINE 6.0 08/24/2016 1754   GLUCOSEU NEGATIVE 08/24/2016 1754   HGBUR NEGATIVE 08/24/2016 1754   BILIRUBINUR NEGATIVE 08/24/2016 1754   KETONESUR NEGATIVE 08/24/2016 1754   PROTEINUR NEGATIVE 08/24/2016 1754   NITRITE NEGATIVE 08/24/2016 1754   LEUKOCYTESUR TRACE (A) 08/24/2016 1754   Sepsis Labs Invalid input(s): PROCALCITONIN,  WBC,   LACTICIDVEN Microbiology No results found for this or any previous visit (from the past 240 hour(s)).   Time coordinating discharge: Over 30 minutes  SIGNED:   Debbe Odea, MD  [facility] 08/25/2016, 2:43 PM Pager   If 7PM-7AM, please contact night-coverage [email or web address] Password [email or web address]

## 2016-08-25 NOTE — Care Management Note (Signed)
Case Management Note  Patient Details  Name: Jenny Kelley MRN: [id number] Date of Birth: 17-Nov-1950  Subjective/Objective:  Pt admitted with CVA. She is from home with her spouse.                   Action/Plan: PT/OT recommending [place] services. CM following for d/c needs, physician orders.  Expected Discharge Date:                  Expected Discharge Plan:     In-House Referral:     Discharge planning Services     Post Acute Care Choice:    Choice offered to:     DME Arranged:    DME Agency:     HH Arranged:    HH Agency:     Status of Service:  In process, will continue to follow  If discussed at Long Length of Stay Meetings, dates discussed:    Additional Comments:  Pollie Friar, RN 08/25/2016, 1:50 PM

## 2016-08-26 DIAGNOSIS — E119 Type 2 diabetes mellitus without complications: Secondary | ICD-10-CM | POA: Diagnosis not present

## 2016-08-26 DIAGNOSIS — D649 Anemia, unspecified: Secondary | ICD-10-CM | POA: Diagnosis not present

## 2016-08-26 DIAGNOSIS — Z7901 Long term (current) use of anticoagulants: Secondary | ICD-10-CM | POA: Diagnosis not present

## 2016-08-26 DIAGNOSIS — Z7984 Long term (current) use of oral hypoglycemic drugs: Secondary | ICD-10-CM | POA: Diagnosis not present

## 2016-08-26 DIAGNOSIS — I69354 Hemiplegia and hemiparesis following cerebral infarction affecting left non-dominant side: Secondary | ICD-10-CM | POA: Diagnosis not present

## 2016-08-26 DIAGNOSIS — I10 Essential (primary) hypertension: Secondary | ICD-10-CM | POA: Diagnosis not present

## 2016-08-26 DIAGNOSIS — E785 Hyperlipidemia, unspecified: Secondary | ICD-10-CM | POA: Diagnosis not present

## 2016-08-28 ENCOUNTER — Other Ambulatory Visit: Payer: Self-pay | Admitting: *Deleted

## 2016-08-28 ENCOUNTER — Encounter: Payer: Self-pay | Admitting: *Deleted

## 2016-08-28 DIAGNOSIS — I69354 Hemiplegia and hemiparesis following cerebral infarction affecting left non-dominant side: Secondary | ICD-10-CM | POA: Diagnosis not present

## 2016-08-28 DIAGNOSIS — E785 Hyperlipidemia, unspecified: Secondary | ICD-10-CM | POA: Diagnosis not present

## 2016-08-28 DIAGNOSIS — D649 Anemia, unspecified: Secondary | ICD-10-CM | POA: Diagnosis not present

## 2016-08-28 DIAGNOSIS — E119 Type 2 diabetes mellitus without complications: Secondary | ICD-10-CM | POA: Diagnosis not present

## 2016-08-28 DIAGNOSIS — Z7984 Long term (current) use of oral hypoglycemic drugs: Secondary | ICD-10-CM | POA: Diagnosis not present

## 2016-08-28 DIAGNOSIS — Z7901 Long term (current) use of anticoagulants: Secondary | ICD-10-CM | POA: Diagnosis not present

## 2016-08-28 DIAGNOSIS — I10 Essential (primary) hypertension: Secondary | ICD-10-CM | POA: Diagnosis not present

## 2016-08-28 NOTE — Patient Outreach (Signed)
[facility]) Care Management  08/28/2016  Jenny Kelley December 03, 1950 [id number]  EMMI-Stroke Red Alert referral Day #1, 08/27/2016 Reason: Scheduled a follow up appointment-no.  Call #1 to patient who gave HIPPA verification.  Voices that she had not set up appointment when call was received but since then has made appointment with neurology office.  Patient was questioned if she had Primary care provider & whether she had made appointment for hospital follow up. Patient voiced that she had not made appointment. She was advised of importance of making hospital follow up appointment within 2 weeks of hospital discharge. Patient voices understanding & plans to call to make appointment with Dr. Vista Lawman.  Patient voices that spouse will provide transportation to appointments  Patient voices she has started with [place] services that are providing physical & occupational services. States she is using walker due to left sided weakness. Voices that she has not experienced any falls since discharge. Falls preventions discussed with patient (she voices understanding).  Patient voices no difficulties getting prescriptions filled & that she is taking medications as prescribed.   Stroke symptoms reviewed with patient. States she has not had any further symptoms since admitted to hospital with left sided weakness & numbness. Voices understanding of importance of calling 911 if symptoms occur.   Patient has no further questions. EMMI red alert has been addressed.  Plan: Send EMMI literature on stroke symptoms & falls preventions as a means of re-enforcement of information that patient knows. Close case/send to care management assistant.   Sherrin Daisy, RN BSN [place] Management Coordinator [facility] Care Management  [phone]

## 2016-08-31 DIAGNOSIS — Z7901 Long term (current) use of anticoagulants: Secondary | ICD-10-CM | POA: Diagnosis not present

## 2016-08-31 DIAGNOSIS — I69359 Hemiplegia and hemiparesis following cerebral infarction affecting unspecified side: Secondary | ICD-10-CM | POA: Diagnosis not present

## 2016-08-31 DIAGNOSIS — I6310 Cerebral infarction due to embolism of unspecified precerebral artery: Secondary | ICD-10-CM | POA: Diagnosis not present

## 2016-08-31 DIAGNOSIS — E119 Type 2 diabetes mellitus without complications: Secondary | ICD-10-CM | POA: Diagnosis not present

## 2016-08-31 DIAGNOSIS — Z0000 Encounter for general adult medical examination without abnormal findings: Secondary | ICD-10-CM | POA: Diagnosis not present

## 2016-08-31 DIAGNOSIS — D649 Anemia, unspecified: Secondary | ICD-10-CM | POA: Diagnosis not present

## 2016-08-31 DIAGNOSIS — Z7984 Long term (current) use of oral hypoglycemic drugs: Secondary | ICD-10-CM | POA: Diagnosis not present

## 2016-08-31 DIAGNOSIS — R531 Weakness: Secondary | ICD-10-CM | POA: Diagnosis not present

## 2016-08-31 DIAGNOSIS — I69354 Hemiplegia and hemiparesis following cerebral infarction affecting left non-dominant side: Secondary | ICD-10-CM | POA: Diagnosis not present

## 2016-08-31 DIAGNOSIS — E785 Hyperlipidemia, unspecified: Secondary | ICD-10-CM | POA: Diagnosis not present

## 2016-08-31 DIAGNOSIS — I6340 Cerebral infarction due to embolism of unspecified cerebral artery: Secondary | ICD-10-CM | POA: Diagnosis not present

## 2016-08-31 DIAGNOSIS — I10 Essential (primary) hypertension: Secondary | ICD-10-CM | POA: Diagnosis not present

## 2016-09-01 DIAGNOSIS — I69354 Hemiplegia and hemiparesis following cerebral infarction affecting left non-dominant side: Secondary | ICD-10-CM | POA: Diagnosis not present

## 2016-09-01 DIAGNOSIS — Z7901 Long term (current) use of anticoagulants: Secondary | ICD-10-CM | POA: Diagnosis not present

## 2016-09-01 DIAGNOSIS — E785 Hyperlipidemia, unspecified: Secondary | ICD-10-CM | POA: Diagnosis not present

## 2016-09-01 DIAGNOSIS — I10 Essential (primary) hypertension: Secondary | ICD-10-CM | POA: Diagnosis not present

## 2016-09-01 DIAGNOSIS — Z7984 Long term (current) use of oral hypoglycemic drugs: Secondary | ICD-10-CM | POA: Diagnosis not present

## 2016-09-01 DIAGNOSIS — D649 Anemia, unspecified: Secondary | ICD-10-CM | POA: Diagnosis not present

## 2016-09-01 DIAGNOSIS — E119 Type 2 diabetes mellitus without complications: Secondary | ICD-10-CM | POA: Diagnosis not present

## 2016-09-03 DIAGNOSIS — D649 Anemia, unspecified: Secondary | ICD-10-CM | POA: Diagnosis not present

## 2016-09-03 DIAGNOSIS — I10 Essential (primary) hypertension: Secondary | ICD-10-CM | POA: Diagnosis not present

## 2016-09-03 DIAGNOSIS — I69354 Hemiplegia and hemiparesis following cerebral infarction affecting left non-dominant side: Secondary | ICD-10-CM | POA: Diagnosis not present

## 2016-09-03 DIAGNOSIS — E119 Type 2 diabetes mellitus without complications: Secondary | ICD-10-CM | POA: Diagnosis not present

## 2016-09-03 DIAGNOSIS — Z7901 Long term (current) use of anticoagulants: Secondary | ICD-10-CM | POA: Diagnosis not present

## 2016-09-03 DIAGNOSIS — E785 Hyperlipidemia, unspecified: Secondary | ICD-10-CM | POA: Diagnosis not present

## 2016-09-03 DIAGNOSIS — Z7984 Long term (current) use of oral hypoglycemic drugs: Secondary | ICD-10-CM | POA: Diagnosis not present

## 2016-09-07 DIAGNOSIS — E119 Type 2 diabetes mellitus without complications: Secondary | ICD-10-CM | POA: Diagnosis not present

## 2016-09-07 DIAGNOSIS — I10 Essential (primary) hypertension: Secondary | ICD-10-CM | POA: Diagnosis not present

## 2016-09-07 DIAGNOSIS — D649 Anemia, unspecified: Secondary | ICD-10-CM | POA: Diagnosis not present

## 2016-09-07 DIAGNOSIS — E785 Hyperlipidemia, unspecified: Secondary | ICD-10-CM | POA: Diagnosis not present

## 2016-09-07 DIAGNOSIS — Z7901 Long term (current) use of anticoagulants: Secondary | ICD-10-CM | POA: Diagnosis not present

## 2016-09-07 DIAGNOSIS — I69354 Hemiplegia and hemiparesis following cerebral infarction affecting left non-dominant side: Secondary | ICD-10-CM | POA: Diagnosis not present

## 2016-09-07 DIAGNOSIS — Z7984 Long term (current) use of oral hypoglycemic drugs: Secondary | ICD-10-CM | POA: Diagnosis not present

## 2016-09-09 DIAGNOSIS — E785 Hyperlipidemia, unspecified: Secondary | ICD-10-CM | POA: Diagnosis not present

## 2016-09-09 DIAGNOSIS — I10 Essential (primary) hypertension: Secondary | ICD-10-CM | POA: Diagnosis not present

## 2016-09-09 DIAGNOSIS — Z7984 Long term (current) use of oral hypoglycemic drugs: Secondary | ICD-10-CM | POA: Diagnosis not present

## 2016-09-09 DIAGNOSIS — Z7901 Long term (current) use of anticoagulants: Secondary | ICD-10-CM | POA: Diagnosis not present

## 2016-09-09 DIAGNOSIS — I69354 Hemiplegia and hemiparesis following cerebral infarction affecting left non-dominant side: Secondary | ICD-10-CM | POA: Diagnosis not present

## 2016-09-09 DIAGNOSIS — E119 Type 2 diabetes mellitus without complications: Secondary | ICD-10-CM | POA: Diagnosis not present

## 2016-09-09 DIAGNOSIS — D649 Anemia, unspecified: Secondary | ICD-10-CM | POA: Diagnosis not present

## 2016-09-11 DIAGNOSIS — D649 Anemia, unspecified: Secondary | ICD-10-CM | POA: Diagnosis not present

## 2016-09-11 DIAGNOSIS — Z7984 Long term (current) use of oral hypoglycemic drugs: Secondary | ICD-10-CM | POA: Diagnosis not present

## 2016-09-11 DIAGNOSIS — Z7901 Long term (current) use of anticoagulants: Secondary | ICD-10-CM | POA: Diagnosis not present

## 2016-09-11 DIAGNOSIS — I10 Essential (primary) hypertension: Secondary | ICD-10-CM | POA: Diagnosis not present

## 2016-09-11 DIAGNOSIS — E785 Hyperlipidemia, unspecified: Secondary | ICD-10-CM | POA: Diagnosis not present

## 2016-09-11 DIAGNOSIS — E119 Type 2 diabetes mellitus without complications: Secondary | ICD-10-CM | POA: Diagnosis not present

## 2016-09-11 DIAGNOSIS — I69354 Hemiplegia and hemiparesis following cerebral infarction affecting left non-dominant side: Secondary | ICD-10-CM | POA: Diagnosis not present

## 2016-09-14 DIAGNOSIS — E785 Hyperlipidemia, unspecified: Secondary | ICD-10-CM | POA: Diagnosis not present

## 2016-09-14 DIAGNOSIS — I69354 Hemiplegia and hemiparesis following cerebral infarction affecting left non-dominant side: Secondary | ICD-10-CM | POA: Diagnosis not present

## 2016-09-14 DIAGNOSIS — Z7901 Long term (current) use of anticoagulants: Secondary | ICD-10-CM | POA: Diagnosis not present

## 2016-09-14 DIAGNOSIS — E119 Type 2 diabetes mellitus without complications: Secondary | ICD-10-CM | POA: Diagnosis not present

## 2016-09-14 DIAGNOSIS — D649 Anemia, unspecified: Secondary | ICD-10-CM | POA: Diagnosis not present

## 2016-09-14 DIAGNOSIS — Z7984 Long term (current) use of oral hypoglycemic drugs: Secondary | ICD-10-CM | POA: Diagnosis not present

## 2016-09-14 DIAGNOSIS — I10 Essential (primary) hypertension: Secondary | ICD-10-CM | POA: Diagnosis not present

## 2016-09-15 ENCOUNTER — Encounter (HOSPITAL_COMMUNITY): Payer: Self-pay

## 2016-09-15 ENCOUNTER — Other Ambulatory Visit: Payer: Self-pay

## 2016-09-15 ENCOUNTER — Emergency Department (HOSPITAL_COMMUNITY)
Admission: EM | Admit: 2016-09-15 | Discharge: 2016-09-15 | Disposition: A | Discharge status: Home / Self Care | Payer: Medicare Other | Attending: Emergency Medicine | Admitting: Emergency Medicine

## 2016-09-15 ENCOUNTER — Emergency Department (HOSPITAL_COMMUNITY): Payer: Medicare Other

## 2016-09-15 DIAGNOSIS — I10 Essential (primary) hypertension: Secondary | ICD-10-CM | POA: Diagnosis not present

## 2016-09-15 DIAGNOSIS — Z7984 Long term (current) use of oral hypoglycemic drugs: Secondary | ICD-10-CM | POA: Insufficient documentation

## 2016-09-15 DIAGNOSIS — R072 Precordial pain: Secondary | ICD-10-CM | POA: Diagnosis not present

## 2016-09-15 DIAGNOSIS — Z87891 Personal history of nicotine dependence: Secondary | ICD-10-CM | POA: Diagnosis not present

## 2016-09-15 DIAGNOSIS — Z8673 Personal history of transient ischemic attack (TIA), and cerebral infarction without residual deficits: Secondary | ICD-10-CM | POA: Insufficient documentation

## 2016-09-15 DIAGNOSIS — Z79899 Other long term (current) drug therapy: Secondary | ICD-10-CM | POA: Diagnosis not present

## 2016-09-15 DIAGNOSIS — R1013 Epigastric pain: Secondary | ICD-10-CM | POA: Insufficient documentation

## 2016-09-15 DIAGNOSIS — R079 Chest pain, unspecified: Secondary | ICD-10-CM

## 2016-09-15 DIAGNOSIS — M7989 Other specified soft tissue disorders: Secondary | ICD-10-CM | POA: Diagnosis not present

## 2016-09-15 DIAGNOSIS — E119 Type 2 diabetes mellitus without complications: Secondary | ICD-10-CM | POA: Insufficient documentation

## 2016-09-15 DIAGNOSIS — R2242 Localized swelling, mass and lump, left lower limb: Secondary | ICD-10-CM | POA: Insufficient documentation

## 2016-09-15 DIAGNOSIS — R0602 Shortness of breath: Secondary | ICD-10-CM | POA: Diagnosis not present

## 2016-09-15 DIAGNOSIS — Z85038 Personal history of other malignant neoplasm of large intestine: Secondary | ICD-10-CM | POA: Insufficient documentation

## 2016-09-15 DIAGNOSIS — R002 Palpitations: Secondary | ICD-10-CM | POA: Diagnosis not present

## 2016-09-15 DIAGNOSIS — M25572 Pain in left ankle and joints of left foot: Secondary | ICD-10-CM

## 2016-09-15 LAB — BASIC METABOLIC PANEL
ANION GAP: 8 (ref 5–15)
BUN: 11 mg/dL (ref 6–20)
CALCIUM: 9.5 mg/dL (ref 8.9–10.3)
CO2: 25 mmol/L (ref 22–32)
Chloride: 106 mmol/L (ref 101–111)
Creatinine, Ser: 0.66 mg/dL (ref 0.44–1.00)
GFR calc Af Amer: >60 mL/min (ref >60)
GFR calc non Af Amer: >60 mL/min (ref >60)
GLUCOSE: 206 mg/dL — AB (ref 65–99)
Potassium: 3.5 mmol/L (ref 3.5–5.1)
Sodium: 139 mmol/L (ref 135–145)

## 2016-09-15 LAB — CBC
HEMATOCRIT: 35.7 % — AB (ref 36.0–46.0)
HEMOGLOBIN: 11.1 g/dL — AB (ref 12.0–15.0)
MCH: 28.6 pg (ref 26.0–34.0)
MCHC: 31.1 g/dL (ref 30.0–36.0)
MCV: 92 fL (ref 78.0–100.0)
Platelets: 326 10*3/uL (ref 150–400)
RBC: 3.88 MIL/uL (ref 3.87–5.11)
RDW: 13.6 % (ref 11.5–15.5)
WBC: 5.2 10*3/uL (ref 4.0–10.5)

## 2016-09-15 LAB — I-STAT TROPONIN, ED
TROPONIN I, POC: 0 ng/mL (ref 0.00–0.08)
Troponin i, poc: 0 ng/mL (ref 0.00–0.08)

## 2016-09-15 NOTE — ED Notes (Signed)
Patient able to ambulate independently  

## 2016-09-15 NOTE — ED Notes (Signed)
PA at bedside.

## 2016-09-15 NOTE — Discharge Instructions (Signed)
Please read the attached instructions carefully. Please return to the emergency room if you have any concerns.    If your chest pain happens again please immediately call [phone].    Please follow-up with your primary care doctor regarding your ankle pain, and rash.

## 2016-09-15 NOTE — ED Provider Notes (Signed)
[facility] DEPT Provider Note   CSN: [id number] Arrival date & time: 09/15/16  1130     History   Chief Complaint Chief Complaint  Patient presents with  . Chest Pain  . Rash  . Ankle Injury    HPI Jenny Kelley is a 66 y.o. female with multiple complaints,with a history of stroke on 08/24/16, HTN, HLD, DM2, who presents for approximately 5 minutes of central substernal/epigastric chest pain that occurred today while she was seated around 5 hours ago. She reports that it stopped without intervention. She denies any other symptoms during this time other than feeling like that her heart was "working very hard."  She denies any recent illness, fevers/chills. No nausea/vomiting.  She reports that she did not take her medications this morning as she normally takes them after she eats lunch and she did not eat lunch yet.  Additionally she reports that yesterday she began developing a red raised rash that itches over her bilateral arms and back. She denies any recent medication changes are new exposures. She reports the rash comes and goes and is currently not present.   Last week she reports a mechanical fall that caused her to twist her left ankle. She reports that it is swollen, painful to bear weight.    HPI  Past Medical History:  Diagnosis Date  . Anemia   . Arthritis    "left knee" (08/24/2016)  . Colon cancer ()   . Headache    "sometimes monthly" (08/24/2016)  . History of blood transfusion ~ 1994   "related to colon cancer"  . HTN (hypertension)   . Hyperlipidemia   . Left-sided weakness 08/24/2016  . Stroke ([place]) 08/24/2016   "Left-sided weakness since" (08/24/2016)  . Type II diabetes mellitus [facility])     Patient Active Problem List   Diagnosis Date Noted  . Left-sided weakness 08/24/2016  . Cerebral embolism with cerebral infarction 08/24/2016  . Diabetes mellitus without complication ([place])   . HTN (hypertension)   . Hyperlipidemia   . Anemia   . Acute  ischemic stroke ([place])   . Diabetes mellitus with complication [facility])     Past Surgical History:  Procedure Laterality Date  . ABDOMINAL HYSTERECTOMY     "partial"  . CATARACT EXTRACTION W/ INTRAOCULAR LENS  IMPLANT, BILATERAL Bilateral ~ 2015  . COLECTOMY  ~ 1994   "colon cancer"    OB History    No data available       Home Medications    Prior to Admission medications   Medication Sig Start Date End Date Taking? Authorizing Provider  Artificial Tear Solution (NATURAL BALANCE OP) Place 1 drop into both eyes daily as needed. Runny eyes   Yes [provider]  atorvastatin (LIPITOR) 40 MG tablet Take 2 tablets (80 mg total) by mouth daily. 08/25/16  Yes Debbe Odea, MD  cholecalciferol (VITAMIN D) 1000 units tablet Take 1,000 Units by mouth daily.   Yes [provider]  clopidogrel (PLAVIX) 75 MG tablet Take 1 tablet (75 mg total) by mouth daily. 08/25/16  Yes Debbe Odea, MD  gabapentin (NEURONTIN) 100 MG capsule Take 200 mg by mouth at bedtime.   Yes [provider]  metFORMIN (GLUCOPHAGE) 1000 MG tablet Take 1,000 mg by mouth 2 (two) times daily with a meal.   Yes [provider]  ramipril (ALTACE) 10 MG capsule Take 10 mg by mouth daily.   Yes [provider]    Family History Family History  Problem Relation Age of Onset  . Cancer Mother        unknown type  . Hypertension Father     Social History Social History  Substance Use Topics  . Smoking status: Former Smoker    Years: 0.50    Quit date: 80  . Smokeless tobacco: Never Used  . Alcohol use No     Allergies   Patient has no known allergies.   Review of Systems Review of Systems  Constitutional: Negative for chills and fever.  HENT: Negative for ear pain and sore throat.   Eyes: Negative for pain and visual disturbance.  Respiratory: Negative for cough, chest tightness, shortness of breath and wheezing.   Cardiovascular: Positive for chest pain and  palpitations. Negative for leg swelling.  Gastrointestinal: Negative for abdominal pain, nausea and vomiting.  Genitourinary: Negative for dysuria and hematuria.  Musculoskeletal: Positive for arthralgias (Left ankle pain and swelling. ). Negative for back pain.  Skin: Negative for color change and rash.  Neurological: Negative for seizures and syncope.  All other systems reviewed and are negative.    Physical Exam Updated Vital Signs BP (!) 179/74   Pulse 68   Temp 98.3 F (36.8 C) (Oral)   Resp 20   Ht 5\' 6"  (1.676 m)   Wt 90.3 kg (199 lb)   SpO2 99%   BMI 32.12 kg/m   Physical Exam  Constitutional: She is oriented to person, place, and time. She appears well-developed and well-nourished. No distress.  HENT:  Head: Normocephalic and atraumatic.  Mouth/Throat: Oropharynx is clear and moist.  Eyes: Conjunctivae are normal. No scleral icterus.  Neck: Normal range of motion. Neck supple. No JVD present.  Cardiovascular: Normal rate, regular rhythm, normal heart sounds and intact distal pulses.  Exam reveals no friction rub.   No murmur heard. Pulmonary/Chest: Effort normal and breath sounds normal. No stridor. No respiratory distress.  Abdominal: Soft. Bowel sounds are normal. She exhibits no distension. There is no tenderness.  Musculoskeletal:  Left ankle swelling, tender to palpation over lateral malleolus. This is intact. No calf tenderness/swelling.  Left foot is neurovascularly intact.  Neurological: She is alert and oriented to person, place, and time.  Skin: Skin is warm and dry.  No obvious rash present to bilateral upper extremities or back.  Psychiatric: She has a normal mood and affect. Her behavior is normal.  Nursing note and vitals reviewed.    ED Treatments / Results  Labs (all labs ordered are listed, but only abnormal results are displayed) Labs Reviewed  BASIC METABOLIC PANEL - Abnormal; Notable for the following:       Result Value   Glucose, Bld  206 (*)    All other components within normal limits  CBC - Abnormal; Notable for the following:    Hemoglobin 11.1 (*)    HCT 35.7 (*)    All other components within normal limits  I-STAT TROPONIN, ED  I-STAT TROPONIN, ED    EKG  EKG Interpretation None     EKG obtained.   Radiology Dg Chest 2 View  Result Date: 09/15/2016 CLINICAL DATA:  Onset of mid chest pain and shortness of breath at 11 a.m. today. His symptoms lasted 5 minutes. The patient reports developing a rash yesterday. No fever. History of previous CVA, hypertension, diabetes, former smoker. EXAM: CHEST  2 VIEW COMPARISON:  None in PACs FINDINGS: The lungs are adequately inflated. There is no focal infiltrate. The cardiac silhouette is mildly enlarged. The pulmonary  vascularity is not engorged. There is calcification in the wall of the aortic arch. There is multilevel degenerative disc disease of the thoracic spine. IMPRESSION: There is no acute pneumonia nor pulmonary edema. There is mild enlargement of the cardiac silhouette. Thoracic aortic atherosclerosis. Electronically Signed   By: David  Martinique M.D.   On: 09/15/2016 12:44   Dg Ankle Complete Left  Addendum Date: 09/15/2016   ADDENDUM REPORT: 09/15/2016 16:52 ADDENDUM: IMPRESSION: 1. Diffuse soft tissue swelling. 2.  No definite evidence for acute fracture identified. Electronically Signed   By: Kerby Moors M.D.   On: 09/15/2016 16:52   Result Date: 09/15/2016 CLINICAL DATA:  Swelling and pain. EXAM: LEFT ANKLE COMPLETE - 3+ VIEW COMPARISON:  None. FINDINGS: There is no evidence of fracture, dislocation, or joint effusion. There is no evidence of arthropathy or other focal bone abnormality. Diffuse soft tissue swelling is identified. There is a well corticated os ossific density adjacent to the medial malleolus seen on the oblique radiographs. This may represent sequelae of prior trauma or an ossicle. IMPRESSION: 1. Diffuse soft tissue swelling. 2. Definite evidence  for acute fracture identified. Electronically Signed: By: Kerby Moors M.D. On: 09/15/2016 16:28    Procedures Procedures (including critical care time)  Medications Ordered in ED Medications - No data to display   Initial Impression / Assessment and Plan / ED Course  I have reviewed the triage vital signs and the nursing notes.  Pertinent labs & imaging results that were available during my care of the patient were reviewed by me and considered in my medical decision making (see chart for details).    Patient is to be discharged with recommendation to follow up with PCP and cardiology in regards to today's hospital visit.  Her chest pain is present for approximately 5 minutes and resolved without intervention. She was completely asymptomatic throughout her entire ED visit.Patient has had negative troponins 2 without evidence of arrhythmia on EKG.  Low suspicion for cardiac or pulmonary etiology of her reported pain as it was brief, resolved without intervention, vital signs stable, no tracheal deviation, JVD, or new murmur with chest x-ray without acute abnormalities. Based on age will give patient follow-up with cardiology, despite low suspicion for true cardiac cause.  Pt has been advised to return to the ED if CP recurs.   Pt appears reliable for follow up and is agreeable to discharge.   X-rays were obtained of her left ankle due to reported pain and swelling, no obvious acute fracture identified, suspect sprain/strain. Patient was educated on rice and conservative treatment for her ankle. She was offered an Ace bandage however declined stating she has one at home.    Patient reported rash yesterday and earlier today however rash is not present during evaluation and therefore I am unable to evaluate her rash.  She was given a red flag warning precautions for when to return to the emergency room and instructed to follow up with her primary care provider regarding this rash, and her  ankle pain.  Case has been discussed with and seen by Dr. Stark Jock who agrees with the above plan to discharge.     Final Clinical Impressions(s) / ED Diagnoses   Final diagnoses:  Chest pain, unspecified type  Acute left ankle pain    New Prescriptions Discharge Medication List as of 09/15/2016  6:27 PM       Lorin Glass, PA-C 09/15/16 2002    Veryl Speak, MD 09/16/16 [phone]

## 2016-09-15 NOTE — ED Notes (Signed)
Patient transported to x-ray. ?

## 2016-09-15 NOTE — ED Triage Notes (Signed)
Onset 11am mid chest pain and shortness of breath, lasted 5 min and symptoms stopped.  Pt reports last week missed her step and fell.  Left ankle swollen, mild pain when turning ankle certain way.  Onset yesterday widespread red raised rash that itches.  No fever.

## 2016-09-17 DIAGNOSIS — Z7901 Long term (current) use of anticoagulants: Secondary | ICD-10-CM | POA: Diagnosis not present

## 2016-09-17 DIAGNOSIS — E119 Type 2 diabetes mellitus without complications: Secondary | ICD-10-CM | POA: Diagnosis not present

## 2016-09-17 DIAGNOSIS — I10 Essential (primary) hypertension: Secondary | ICD-10-CM | POA: Diagnosis not present

## 2016-09-17 DIAGNOSIS — D649 Anemia, unspecified: Secondary | ICD-10-CM | POA: Diagnosis not present

## 2016-09-17 DIAGNOSIS — I69354 Hemiplegia and hemiparesis following cerebral infarction affecting left non-dominant side: Secondary | ICD-10-CM | POA: Diagnosis not present

## 2016-09-17 DIAGNOSIS — Z7984 Long term (current) use of oral hypoglycemic drugs: Secondary | ICD-10-CM | POA: Diagnosis not present

## 2016-09-17 DIAGNOSIS — E785 Hyperlipidemia, unspecified: Secondary | ICD-10-CM | POA: Diagnosis not present

## 2016-09-18 DIAGNOSIS — E119 Type 2 diabetes mellitus without complications: Secondary | ICD-10-CM | POA: Diagnosis not present

## 2016-09-18 DIAGNOSIS — Z7901 Long term (current) use of anticoagulants: Secondary | ICD-10-CM | POA: Diagnosis not present

## 2016-09-18 DIAGNOSIS — E785 Hyperlipidemia, unspecified: Secondary | ICD-10-CM | POA: Diagnosis not present

## 2016-09-18 DIAGNOSIS — D649 Anemia, unspecified: Secondary | ICD-10-CM | POA: Diagnosis not present

## 2016-09-18 DIAGNOSIS — Z7984 Long term (current) use of oral hypoglycemic drugs: Secondary | ICD-10-CM | POA: Diagnosis not present

## 2016-09-18 DIAGNOSIS — I69354 Hemiplegia and hemiparesis following cerebral infarction affecting left non-dominant side: Secondary | ICD-10-CM | POA: Diagnosis not present

## 2016-09-18 DIAGNOSIS — I10 Essential (primary) hypertension: Secondary | ICD-10-CM | POA: Diagnosis not present

## 2016-09-21 DIAGNOSIS — E785 Hyperlipidemia, unspecified: Secondary | ICD-10-CM | POA: Diagnosis not present

## 2016-09-21 DIAGNOSIS — Z7901 Long term (current) use of anticoagulants: Secondary | ICD-10-CM | POA: Diagnosis not present

## 2016-09-21 DIAGNOSIS — E119 Type 2 diabetes mellitus without complications: Secondary | ICD-10-CM | POA: Diagnosis not present

## 2016-09-21 DIAGNOSIS — I69354 Hemiplegia and hemiparesis following cerebral infarction affecting left non-dominant side: Secondary | ICD-10-CM | POA: Diagnosis not present

## 2016-09-21 DIAGNOSIS — I10 Essential (primary) hypertension: Secondary | ICD-10-CM | POA: Diagnosis not present

## 2016-09-21 DIAGNOSIS — Z7984 Long term (current) use of oral hypoglycemic drugs: Secondary | ICD-10-CM | POA: Diagnosis not present

## 2016-09-21 DIAGNOSIS — D649 Anemia, unspecified: Secondary | ICD-10-CM | POA: Diagnosis not present

## 2016-09-22 DIAGNOSIS — D649 Anemia, unspecified: Secondary | ICD-10-CM | POA: Diagnosis not present

## 2016-09-22 DIAGNOSIS — Z7984 Long term (current) use of oral hypoglycemic drugs: Secondary | ICD-10-CM | POA: Diagnosis not present

## 2016-09-22 DIAGNOSIS — Z7901 Long term (current) use of anticoagulants: Secondary | ICD-10-CM | POA: Diagnosis not present

## 2016-09-22 DIAGNOSIS — I69354 Hemiplegia and hemiparesis following cerebral infarction affecting left non-dominant side: Secondary | ICD-10-CM | POA: Diagnosis not present

## 2016-09-22 DIAGNOSIS — E119 Type 2 diabetes mellitus without complications: Secondary | ICD-10-CM | POA: Diagnosis not present

## 2016-09-22 DIAGNOSIS — I10 Essential (primary) hypertension: Secondary | ICD-10-CM | POA: Diagnosis not present

## 2016-09-22 DIAGNOSIS — E785 Hyperlipidemia, unspecified: Secondary | ICD-10-CM | POA: Diagnosis not present

## 2016-09-24 DIAGNOSIS — H5201 Hypermetropia, right eye: Secondary | ICD-10-CM | POA: Diagnosis not present

## 2016-09-24 DIAGNOSIS — I69354 Hemiplegia and hemiparesis following cerebral infarction affecting left non-dominant side: Secondary | ICD-10-CM | POA: Diagnosis not present

## 2016-09-24 DIAGNOSIS — I10 Essential (primary) hypertension: Secondary | ICD-10-CM | POA: Diagnosis not present

## 2016-09-24 DIAGNOSIS — E785 Hyperlipidemia, unspecified: Secondary | ICD-10-CM | POA: Diagnosis not present

## 2016-09-24 DIAGNOSIS — E119 Type 2 diabetes mellitus without complications: Secondary | ICD-10-CM | POA: Diagnosis not present

## 2016-09-24 DIAGNOSIS — Z7984 Long term (current) use of oral hypoglycemic drugs: Secondary | ICD-10-CM | POA: Diagnosis not present

## 2016-09-24 DIAGNOSIS — H26493 Other secondary cataract, bilateral: Secondary | ICD-10-CM | POA: Diagnosis not present

## 2016-09-24 DIAGNOSIS — E113291 Type 2 diabetes mellitus with mild nonproliferative diabetic retinopathy without macular edema, right eye: Secondary | ICD-10-CM | POA: Diagnosis not present

## 2016-09-24 DIAGNOSIS — D3132 Benign neoplasm of left choroid: Secondary | ICD-10-CM | POA: Diagnosis not present

## 2016-09-24 DIAGNOSIS — Z7901 Long term (current) use of anticoagulants: Secondary | ICD-10-CM | POA: Diagnosis not present

## 2016-09-24 DIAGNOSIS — H35033 Hypertensive retinopathy, bilateral: Secondary | ICD-10-CM | POA: Diagnosis not present

## 2016-09-24 DIAGNOSIS — D649 Anemia, unspecified: Secondary | ICD-10-CM | POA: Diagnosis not present

## 2016-10-14 ENCOUNTER — Ambulatory Visit: Payer: Self-pay | Admitting: Neurology

## 2016-10-16 DIAGNOSIS — Z23 Encounter for immunization: Secondary | ICD-10-CM | POA: Diagnosis not present

## 2016-10-16 DIAGNOSIS — Z8673 Personal history of transient ischemic attack (TIA), and cerebral infarction without residual deficits: Secondary | ICD-10-CM | POA: Diagnosis not present

## 2016-10-16 DIAGNOSIS — E119 Type 2 diabetes mellitus without complications: Secondary | ICD-10-CM | POA: Diagnosis not present

## 2016-10-16 DIAGNOSIS — Z7689 Persons encountering health services in other specified circumstances: Secondary | ICD-10-CM | POA: Diagnosis not present

## 2016-10-28 ENCOUNTER — Encounter: Payer: Self-pay | Admitting: Neurology

## 2016-10-28 ENCOUNTER — Ambulatory Visit: Payer: Self-pay | Admitting: Neurology

## 2016-10-28 VITALS — BP 145/80 | HR 68 | Ht 66.0 in | Wt 187.2 lb

## 2016-10-28 DIAGNOSIS — I6302 Cerebral infarction due to thrombosis of basilar artery: Secondary | ICD-10-CM | POA: Diagnosis not present

## 2016-10-28 DIAGNOSIS — R531 Weakness: Secondary | ICD-10-CM

## 2016-10-28 DIAGNOSIS — E119 Type 2 diabetes mellitus without complications: Secondary | ICD-10-CM

## 2016-10-28 DIAGNOSIS — I10 Essential (primary) hypertension: Secondary | ICD-10-CM

## 2016-10-28 DIAGNOSIS — E7849 Other hyperlipidemia: Secondary | ICD-10-CM

## 2016-10-28 NOTE — Patient Instructions (Signed)
-   continue plavix and lipitor for stroke prevention - check BP at home daily for at least 2 weeks and write down. Then bring over or call Dr. Girtha Rm to see if need medication adjustment - Follow up with your primary care physician for stroke risk factor modification. Recommend maintain blood pressure goal <130/80, diabetes with hemoglobin A1c goal below 7.0% and lipids with LDL cholesterol goal below 70 mg/dL.  - check glucose at home and record  - recommend B12 supplement, you can buy over the counter.  - diabetic diet and regular exercise - refer to outpt PT to help left leg weakness. - follow up in 3 months.

## 2016-10-28 NOTE — Progress Notes (Signed)
STROKE NEUROLOGY FOLLOW UP NOTE  NAME: Jenny Kelley DOB: 30-Apr-1950  REASON FOR VISIT: stroke follow up HISTORY FROM: pt and chart  Today we had the pleasure of seeing Jenny Kelley in follow-up at our Neurology Clinic. Pt was accompanied by no one.   History Summary Ms. Jenny Kelley is a 66 y.o. female with history of anemia, colon cancer, headache, hypertension, hyperlipidemia, and type II diabetes mellitus admitted on 08/24/16 for left-sided weakness and chest pain. 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 CTA head and neck showed right M2 and V1 stenosis and left ICA athero. EF 60-65%, UDS neg. LDL 99 and A1C 6.7. Her ASA changed to plavix and lipitor 40 continued. B12 was low at 287.    Interval History During the interval time, the patient has been doing well. Pt still has very mild left hand clumsiness and left leg weakness. Walk with cane. Home PT/OT has completed. Has not got any outpt therapy yet. BP today 145/80, however, at home her BP was 180s. She did not check BP daily and was encouraged to do so. She is on ramipril 10mg  daily.   REVIEW OF SYSTEMS: Full 14 system review of systems performed and notable only for those listed below and in HPI above, all others are negative:  Constitutional:   Cardiovascular:  Ear/Nose/Throat:   Skin:  Eyes:   Respiratory:   Gastroitestinal:   Genitourinary:  Hematology/Lymphatic:   Endocrine:  Musculoskeletal:   Allergy/Immunology:   Neurological:   Psychiatric:  Sleep:   The following represents the patient's updated allergies and side effects list: No Known Allergies  The neurologically relevant items on the patient's problem list were reviewed on today's visit.  Neurologic Examination  A problem focused neurological exam (12 or more points of the single system neurologic examination, vital signs counts as 1 point, cranial nerves count for 8 points) was performed.  Blood pressure (!) 145/80, pulse 68, height 5'  6" (1.676 m), weight 187 lb 3.2 oz (84.9 kg).  General - Well nourished, well developed, in no apparent distress.  Ophthalmologic - Sharp disc margins OU.   Cardiovascular - Regular rate and rhythm with no murmur.  Mental Status -  Level of arousal and orientation to time, place, and person were intact. Language including expression, naming, repetition, comprehension was assessed and found intact. Fund of Knowledge was assessed and was intact.  Cranial Nerves II - XII - II - Visual field intact OU. III, IV, VI - Extraocular movements intact. V - Facial sensation intact bilaterally. VII - Facial movement intact bilaterally. VIII - Hearing & vestibular intact bilaterally. X - Palate elevates symmetrically. XI - Chin turning & shoulder shrug intact bilaterally. XII - Tongue protrusion intact.  Motor Strength - The patient's strength was normal in all extremities and pronator drift was absent except left hand subtle dexterity difficulty and left LE 4+/5.  Bulk was normal and fasciculations were absent.   Motor Tone - Muscle tone was assessed at the neck and appendages and was normal.  Reflexes - The patient's reflexes were 1+ in all extremities and she had no pathological reflexes.  Sensory - Light touch, temperature/pinprick were assessed and were normal.    Coordination - The patient had normal movements in the hands and feet with no ataxia or dysmetria.  Tremor was absent.  Gait and Station - walk with cane and mild left hemiparetic gait   Functional score  mRS = 2   0 - No  symptoms.   1 - No significant disability. Able to carry out all usual activities, despite some symptoms.   2 - Slight disability. Able to look after own affairs without assistance, but unable to carry out all previous activities.   3 - Moderate disability. Requires some help, but able to walk unassisted.   4 - Moderately severe disability. Unable to attend to own bodily needs without assistance, and  unable to walk unassisted.   5 - Severe disability. Requires constant nursing care and attention, bedridden, incontinent.   6 - Dead.   NIH Stroke Scale   Level Of Consciousness 0=Alert; keenly responsive 1=Not alert, but arousable by minor stimulation 2=Not alert, requires repeated stimulation 3=Responds only with reflex movements 0  LOC Questions to Month and Age 56=Answers both questions correctly 1=Answers one question correctly 2=Answers neither question correctly 0  LOC Commands      -Open/Close eyes     -Open/close grip 0=Performs both tasks correctly 1=Performs one task correctly 2=Performs neighter task correctly 0  Best Gaze 0=Normal 1=Partial gaze palsy 2=Forced deviation, or total gaze paresis 0  Visual 0=No visual loss 1=Partial hemianopia 2=Complete hemianopia 3=Bilateral hemianopia (blind including cortical blindness) 0  Facial Palsy 0=Normal symmetrical movement 1=Minor paralysis (asymmetry) 2=Partial paralysis (lower face) 3=Complete paralysis (upper and lower face) 0  Motor  0=No drift, limb holds posture for full 10 seconds 1=Drift, limb holds posture, no drift to bed 2=Some antigravity effort, cannot maintain posture, drifts to bed 3=No effort against gravity, limb falls 4=No movement Right Arm 0     Leg 0    Left Arm 0     Leg 1  Limb Ataxia 0=Absent 1=Present in one limb 2=Present in two limbs 0  Sensory 0=Normal 1=Mild to moderate sensory loss 2=Severe to total sensory loss 0  Best Language 0=No aphasia, normal 1=Mild to moderate aphasia 2=Mute, global aphasia 3=Mute, global aphasia 0  Dysarthria 0=Normal 1=Mild to moderate 2=Severe, unintelligible or mute/anarthric 0  Extinction/Neglect 0=No abnormality 1=Extinction to bilateral simultaneous stimulation 2=Profound neglect 0  Total   1     Data reviewed: I personally reviewed the images and agree with the radiology interpretations.  Ct Angio Head W Or Wo Contrast Ct Angio Neck W  Or Wo Contrast 08/24/2016 IMPRESSION: 1. Negative for emergent large vessel occlusion. 2. High-grade proximal right M2 branch stenosis. 3. Moderate atherosclerotic plaque in the cervical carotids without flow limiting stenosis or ulceration. 4. High-grade atheromatous narrowing of the right V1 segment.   Mr Brain Wo Contrast 08/24/2016 IMPRESSION: Acute infarct right anterior pons. Mild chronic microvascular ischemia in the cerebral white matter.   Ct Head Code Stroke W/o Cm 08/24/2016 IMPRESSION: 1. No acute finding. ASPECTS is 10.  2D Echo 08/25/2016 Study Conclusions - Left ventricle: The cavity size was normal. There was mildconcentric hypertrophy. Systolic function was normal. Theestimated ejection fraction was in the range of 60% to 65%. Wallmotion was normal; there were no regional wall motionabnormalities. Doppler parameters are consistent with abnormalleft ventricular relaxation (grade 1 diastolic dysfunction). EF 60-65%. No source of embolus  Component     Latest Ref Rng & Units 08/24/2016  Cholesterol     0 - 200 mg/dL 190  Triglycerides     <150 mg/dL 121  HDL Cholesterol     >40 mg/dL 67  Total CHOL/HDL Ratio     RATIO 2.8  VLDL     0 - 40 mg/dL 24  LDL (calc)     0 - 99 mg/dL  99  Hemoglobin A1C     4.8 - 5.6 % 6.7 (H)  Mean Plasma Glucose     mg/dL 146  Vitamin B12     180 - 914 pg/mL 287    Assessment: As you may recall, she is a 66 y.o. African American female with PMH of anemia, colon cancer, headache, hypertension, hyperlipidemia, and type II diabetes mellitus admitted on 08/24/16 for left-sided weakness and chest pain. 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 CTA head and neck showed right M2 and V1 stenosis and left ICA athero. EF 60-65%, UDS neg. LDL 99 and A1C 6.7. Her ASA changed to plavix and lipitor 40 continued. B12 was low at 287. Still has mild left LE weakness, walk with cane. Finished home PT/OT. BP at home still high but today in  clinic 145/80.  Plan:  - continue plavix and lipitor for stroke prevention - check BP at home daily for at least 2 weeks and write down. Then bring over or call Dr. Girtha Rm to see if need medication adjustment - Follow up with your primary care physician for stroke risk factor modification. Recommend maintain blood pressure goal <130/80, diabetes with hemoglobin A1c goal below 7.0% and lipids with LDL cholesterol goal below 70 mg/dL.  - check glucose at home and record  - recommend B12 supplement, you can buy over the counter.  - diabetic diet and regular exercise - refer to outpt PT to help left leg weakness. - follow up in 3 months.   I spent more than 25 minutes of face to face time with the patient. Greater than 50% of time was spent in counseling and coordination of care. We discussed further PT, BP and glucose monitoring and compliance with medication.   Orders Placed This Encounter  Procedures  . Ambulatory referral to Physical Therapy    Referral Priority:   Routine    Referral Type:   Physical Medicine    Referral Reason:   Specialty Services Required    Requested Specialty:   Physical Therapy    Number of Visits Requested:   1    Meds ordered this encounter  Medications  . DISCONTD: gabapentin (NEURONTIN) 100 MG capsule    Sig: Take by mouth.  Marland Kitchen glimepiride (AMARYL) 4 MG tablet    Sig: Take by mouth.  . DISCONTD: metFORMIN (GLUCOPHAGE) 1000 MG tablet    Sig: Take by mouth.  . DISCONTD: atorvastatin (LIPITOR) 40 MG tablet    Sig: Take by mouth.  . DISCONTD: clopidogrel (PLAVIX) 75 MG tablet    Sig: Take by mouth.  . DISCONTD: pravastatin (PRAVACHOL) 20 MG tablet    Sig: Take 20 mg by mouth daily.    Patient Instructions  - continue plavix and lipitor for stroke prevention - check BP at home daily for at least 2 weeks and write down. Then bring over or call Dr. Girtha Rm to see if need medication adjustment - Follow up with your primary care physician for stroke risk factor  modification. Recommend maintain blood pressure goal <130/80, diabetes with hemoglobin A1c goal below 7.0% and lipids with LDL cholesterol goal below 70 mg/dL.  - check glucose at home and record  - recommend B12 supplement, you can buy over the counter.  - diabetic diet and regular exercise - refer to outpt PT to help left leg weakness. - follow up in 3 months.    Rosalin Hawking, MD PhD [facility] [address], [place], [place] [address] [phone]

## 2016-11-06 ENCOUNTER — Ambulatory Visit: Payer: Medicare Other | Admitting: Physical Therapy

## 2016-11-12 ENCOUNTER — Ambulatory Visit: Payer: Medicare Other | Attending: Neurology | Admitting: Physical Therapy

## 2016-11-12 DIAGNOSIS — M6281 Muscle weakness (generalized): Secondary | ICD-10-CM | POA: Diagnosis not present

## 2016-11-12 DIAGNOSIS — R262 Difficulty in walking, not elsewhere classified: Secondary | ICD-10-CM | POA: Diagnosis not present

## 2016-11-12 DIAGNOSIS — R2681 Unsteadiness on feet: Secondary | ICD-10-CM | POA: Diagnosis not present

## 2016-11-12 DIAGNOSIS — I69354 Hemiplegia and hemiparesis following cerebral infarction affecting left non-dominant side: Secondary | ICD-10-CM

## 2016-11-12 DIAGNOSIS — R2689 Other abnormalities of gait and mobility: Secondary | ICD-10-CM

## 2016-11-12 NOTE — Therapy (Signed)
[facility] [address]  [place], [place], [address] Phone: [phone]   Fax:  [phone]  Physical Therapy Evaluation  Patient Details  Name: SPENSER CONG MRN: [id number] Date of Birth: April 15, 1950 Referring Provider: Rosalin Hawking, MD  Encounter Date: 11/12/2016      PT End of Session - 11/12/16 1019    Visit Number 1   Number of Visits 16   Date for PT Re-Evaluation 01/08/17   Authorization Type UHC Medicare   PT Start Time 0930   PT Stop Time 1019   PT Time Calculation (min) 49 min   Activity Tolerance Patient tolerated treatment well   Behavior During Therapy [facility] for tasks assessed/performed      Past Medical History:  Diagnosis Date  . Anemia   . Arthritis    "left knee" (08/24/2016)  . Colon cancer ([place])   . Headache    "sometimes monthly" (08/24/2016)  . History of blood transfusion ~ 1994   "related to colon cancer"  . HTN (hypertension)   . Hyperlipidemia   . Left-sided weakness 08/24/2016  . Stroke ([place]) 08/24/2016   "Left-sided weakness since" (08/24/2016)  . Type II diabetes mellitus ([place])     Past Surgical History:  Procedure Laterality Date  . ABDOMINAL HYSTERECTOMY     "partial"  . CATARACT EXTRACTION W/ INTRAOCULAR LENS  IMPLANT, BILATERAL Bilateral ~ 2015  . COLECTOMY  ~ 1994   "colon cancer"    There were no vitals filed for this visit.       Subjective Assessment - 11/12/16 0932    Subjective Pt reports she had a stroke in July, leaving her with L LE weakness and impaired balance which make her feel unsecure at times.   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 No/denies            [facility] PT Assessment - 11/12/16 0930      Assessment   Medical Diagnosis CVA - L hemiparesis   Referring Provider Rosalin Hawking, MD   Onset Date/Surgical Date 08/24/16   Hand Dominance Right    Next MD Visit 02/10/17   Prior Therapy [facility] PT      Precautions   Precautions Fall     Balance Screen   Has the patient fallen in the past 6 months Yes   How many times? 2   Has the patient had a decrease in activity level because of a fear of falling?  No   Is the patient reluctant to leave their home because of a fear of falling?  No     Home Environment   Living Environment Private residence   Living Arrangements Spouse/significant other   Type of [place] to enter   Entrance Stairs-Number of Steps [place] One level   [place] - single point  walking stick/handmade cane     Prior Function   Vocation Full time employment   Programmer, systems   Leisure card games on computer; gym 3x/wk - walk, weight machines     Observation/Other Assessments   Focus on Therapeutic Outcomes (FOTO)  Neuro - 62% (38% limitation); predicted 67% (33% limitation)     Sensation   Light Touch Appears Intact     Coordination   Gross Motor Movements are Fluid  and Coordinated No   Heel Shin Test impaired on L     ROM / Strength   AROM / PROM / Strength Strength     Strength   Strength Assessment Site Hip;Knee;Ankle   Right/Left Hip Right;Left   Right Hip Flexion 4/5   Right Hip Extension 3+/5   Right Hip External Rotation  4-/5   Right Hip Internal Rotation 4/5   Right Hip ABduction 4-/5   Right Hip ADduction 4-/5   Left Hip Flexion 3+/5   Left Hip Extension 3-/5   Left Hip External Rotation 3/5   Left Hip Internal Rotation 3+/5   Left Hip ABduction 3+/5   Left Hip ADduction 3-/5   Right/Left Knee Right;Left   Right Knee Flexion 4/5   Right Knee Extension 4+/5   Left Knee Flexion 4-/5   Left Knee Extension 4/5   Right/Left Ankle Right;Left   Right Ankle Dorsiflexion 4/5   Right Ankle Plantar Flexion 4/5   Right Ankle Inversion 4+/5   Right Ankle Eversion 4/5   Left Ankle Dorsiflexion 3+/5   Left  Ankle Plantar Flexion 3+/5   Left Ankle Inversion 3+/5   Left Ankle Eversion 3/5     Ambulation/Gait   Gait velocity 2.10 ft/sec with walking stick     Standardized Balance Assessment   Standardized Balance Assessment Berg Balance Test;Timed Up and Go Test;10 meter walk test   10 Meter Walk 15.63" with walking stick     Berg Balance Test   Sit to Stand Able to stand without using hands and stabilize independently   Standing Unsupported Able to stand safely 2 minutes   Sitting with Back Unsupported but Feet Supported on Floor or Stool Able to sit safely and securely 2 minutes   Stand to Sit Sits safely with minimal use of hands   Transfers Able to transfer safely, minor use of hands   Standing Unsupported with Eyes Closed Able to stand 10 seconds with supervision   Standing Ubsupported with Feet Together Able to place feet together independently and stand 1 minute safely   From Standing, Reach Forward with Outstretched Arm Can reach forward >12 cm safely (5")   From Standing Position, Pick up Object from Floor Able to pick up shoe safely and easily   From Standing Position, Turn to Look Behind Over each Shoulder Looks behind one side only/other side shows less weight shift   Turn 360 Degrees Able to turn 360 degrees safely but slowly   Standing Unsupported, Alternately Place Feet on Step/Stool Able to complete >2 steps/needs minimal assist   Standing Unsupported, One Foot in Front Able to plae foot ahead of the other independently and hold 30 seconds   Standing on One Leg Tries to lift leg/unable to hold 3 seconds but remains standing independently   Total Score 44   Berg comment: 37-45 significant fall risk (>80%)      Timed Up and Go Test   Normal TUG (seconds) 17.04   Manual TUG (seconds) 19.94   Cognitive TUG (seconds) 17.82   TUG Comments tested w/o AD     Functional Gait  Assessment   Gait assessed  Yes   Gait Level Surface Walks 20 ft, slow speed, abnormal gait pattern,  evidence for imbalance or deviates 10-15 in outside of the 12 in walkway width. Requires more than 7 sec to ambulate 20 ft.   Change in Gait Speed Cannot change speeds, deviates greater than 15 in outside 12 in walkway width, or  loses balance and has to reach for wall or be caught.   Gait with Horizontal Head Turns Performs head turns with moderate changes in gait velocity, slows down, deviates 10-15 in outside 12 in walkway width but recovers, can continue to walk.   Gait with Vertical Head Turns Performs task with moderate change in gait velocity, slows down, deviates 10-15 in outside 12 in walkway width but recovers, can continue to walk.   Gait and Pivot Turn Turns slowly, requires verbal cueing, or requires several small steps to catch balance following turn and stop   Step Over Obstacle Is able to step over one shoe box (4.5 in total height) but must slow down and adjust steps to clear box safely. May require verbal cueing.   Gait with Narrow Base of Support Ambulates 4-7 steps.   Gait with Eyes Closed Walks 20 ft, slow speed, abnormal gait pattern, evidence for imbalance, deviates 10-15 in outside 12 in walkway width. Requires more than 9 sec to ambulate 20 ft.   Ambulating Backwards Walks 20 ft, slow speed, abnormal gait pattern, evidence for imbalance, deviates 10-15 in outside 12 in walkway width.   Steps Two feet to a stair, must use rail.   Total Score 9   FGA comment: < 19 = high risk fall            Objective measurements completed on examination: See above findings.                    PT Short Term Goals - 11/12/16 1019      PT SHORT TERM GOAL #1   Title Independent with initial strengthening HEP   Status New   Target Date 12/04/16     PT SHORT TERM GOAL #2   Title Berg >/= 50/56 to reduce risk for falls   Status New   Target Date 12/11/16     PT SHORT TERM GOAL #3   Title Normal TUG </= 13.5 sec to increase safety with transitions and reduce risk for  falls   Status New   Target Date 12/11/16           PT Long Term Goals - 11/12/16 1019      PT LONG TERM GOAL #1   Title Independent with ongoing strengthening +/- balance HEP/gym program    Status New   Target Date 01/08/17     PT LONG TERM GOAL #2   Title Overall R LE strength >/= 4/5 and L LE strength >/= 4-/5 for improved stability   Status New   Target Date 01/08/17     PT LONG TERM GOAL #3   Title Gait speed w/o AD >/= 3.0 ft/sec to increase safety with community ambulation   Status New   Target Date 01/08/17     PT LONG TERM GOAL #4   Title FGA >/= 19/30 to decrease risk for falls   Status New   Target Date 01/08/17                Plan - 11/12/16 1019    Clinical Impression Statement Rhilee is a 66 y/o female referred to OP PT for L non-dominant hemiparesis following and acute ischemic CVA on 08/24/16. Pt arrives to PT ambulating with walking stick/cane, noting she has only used cane since the CVA due to weakness and impaired balance. Mild to moderate weakness present in B LE, L > R. Balance testing via TUG, Berg & FGA indicates a high risk for  falls with pt reporting 2 falls since CVA. Jesenya demonstrates good potential to benefit from skilled PT for LE strengthening, balance and dynamic gait training with hopeful ability to wean from need for AD, and training in fall prevention program to reduce risk for further injury.   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, DM, HTN   Clinical Presentation Evolving   Clinical Presentation due to: 2 falls in last 2 months   Clinical Decision Making Moderate   Rehab Potential Good   PT Frequency 2x / week   PT Duration 8 weeks   PT Treatment/Interventions Patient/family education;Neuromuscular re-education;Balance training;Therapeutic exercise;Therapeutic activities;Functional mobility training;Gait training;Stair training;ADLs/Self Care Home Management   Consulted and Agree  with Plan of Care Patient      Patient will benefit from skilled therapeutic intervention in order to improve the following deficits and impairments:  Decreased strength, Decreased balance, Decreased coordination, Difficulty walking, Abnormal gait  Visit Diagnosis: Hemiplegia and hemiparesis following cerebral infarction affecting left non-dominant side (HCC)  Unsteadiness on feet  Other abnormalities of gait and mobility  Difficulty in walking, not elsewhere classified  Muscle weakness (generalized)      G-Codes - Nov 22, 2016 1652    Functional Assessment Tool Used (Outpatient Only) Berg, TUG & FGA + clinical judgement   Functional Limitation Mobility: Walking and moving around   Mobility: Walking and Moving Around Current Status ([id number]) At least 40 percent but less than 60 percent impaired, limited or restricted   Mobility: Walking and Moving Around Goal Status [phone]) At least 20 percent but less than 40 percent impaired, limited or restricted       Problem List Patient Active Problem List   Diagnosis Date Noted  . 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 Left-sided weakness 08/24/2016  . Cerebral embolism with cerebral infarction 08/24/2016  . Diabetes mellitus without complication ([place])   . Essential hypertension   . Hyperlipidemia   . Anemia   . Acute ischemic stroke ([place])   . Diabetes mellitus with complication [facility])     Percival Spanish, PT, MPT 11-22-2016, 4:56 PM  [facility] [address]  [place], [place], [address] Phone: [phone]   Fax:  [phone]  Name: CERYS WINGET MRN: [id number] Date of Birth: [birth year]

## 2016-11-18 ENCOUNTER — Ambulatory Visit: Payer: Medicare Other

## 2016-11-18 DIAGNOSIS — R2689 Other abnormalities of gait and mobility: Secondary | ICD-10-CM

## 2016-11-18 DIAGNOSIS — R2681 Unsteadiness on feet: Secondary | ICD-10-CM

## 2016-11-18 DIAGNOSIS — M6281 Muscle weakness (generalized): Secondary | ICD-10-CM

## 2016-11-18 DIAGNOSIS — R262 Difficulty in walking, not elsewhere classified: Secondary | ICD-10-CM

## 2016-11-18 DIAGNOSIS — I69354 Hemiplegia and hemiparesis following cerebral infarction affecting left non-dominant side: Secondary | ICD-10-CM | POA: Diagnosis not present

## 2016-11-18 NOTE — Therapy (Signed)
[facility] [address]  [place], [place], [address] Phone: [phone]   Fax:  [phone]  Physical Therapy Treatment  Patient Details  Name: CANDIDA VETTER MRN: [id number] Date of Birth: 1950/02/28 Referring Provider: Rosalin Hawking, MD  Encounter Date: 11/18/2016      PT End of Session - 11/18/16 0801    Visit Number 2   Number of Visits 16   Date for PT Re-Evaluation 01/08/17   Authorization Type UHC Medicare   PT Start Time 0800   PT Stop Time 0845   PT Time Calculation (min) 45 min   Activity Tolerance Patient tolerated treatment well   Behavior During Therapy [facility] for tasks assessed/performed      Past Medical History:  Diagnosis Date  . Anemia   . Arthritis    "left knee" (08/24/2016)  . Colon cancer ([place])   . Headache    "sometimes monthly" (08/24/2016)  . History of blood transfusion ~ 1994   "related to colon cancer"  . HTN (hypertension)   . Hyperlipidemia   . Left-sided weakness 08/24/2016  . Stroke ([place]) 08/24/2016   "Left-sided weakness since" (08/24/2016)  . Type II diabetes mellitus ([place])     Past Surgical History:  Procedure Laterality Date  . ABDOMINAL HYSTERECTOMY     "partial"  . CATARACT EXTRACTION W/ INTRAOCULAR LENS  IMPLANT, BILATERAL Bilateral ~ 2015  . COLECTOMY  ~ 1994   "colon cancer"    There were no vitals filed for this visit.      Subjective Assessment - 11/18/16 0800    Subjective Pt. seen with previous HEP in hand however admitting to not performing this consistently.     Patient Stated Goals "to get stronger and better balanced"   Currently in Pain? No/denies   Multiple Pain Sites No                         [facility] Adult PT Treatment/Exercise - 11/18/16 1006      Exercises   Exercises Knee/Hip;Ankle     Knee/Hip Exercises: Aerobic   Nustep Lvl 5, 6 min      Knee/Hip Exercises: Standing   Hip Flexion Left;10 reps;Knee bent   Hip  Flexion Limitations 2#   Hip Abduction Left;10 reps;Knee straight   Abduction Limitations 2#   Hip Extension Left;10 reps;Knee straight   Extension Limitations 2#   Other Standing Knee Exercises L LE step and reach to cone on bolster x 10 reps; cues for L wt. shift and L knee flexion   1 UE support on counter      Knee/Hip Exercises: Seated   Long Arc Quad Both;10 reps;Weights   Long Arc Quad Weight 1 lbs.   Ball Squeeze 5" x 10 reps    Clamshell with TheraBand Red  2 sets; 3" x 10    Marching 2 sets;Both;10 reps   Sit to Sand 2 sets;10 reps;with UE support;without UE support     Ankle Exercises: Seated   Heel Raises 10 reps;3 seconds   Toe Raise 10 reps;3 seconds   Other Seated Ankle Exercises Seated L ankle IV, EV, DF with red TB x 15 reps each                 PT Education - 11/18/16 0946    Education provided Yes   Education Details seated heel/toe raise, seated clam shell with red TB issued to pt., seated  march, sit<>stand with UE pushoff, LAQ, seated ankle IV, EV, DF with red TB issued to pt.    Person(s) Educated Patient   Methods Explanation;Demonstration;Verbal cues;Handout   Comprehension Verbalized understanding;Returned demonstration;Verbal cues required;Need further instruction          PT Short Term Goals - 11/18/16 0801      PT SHORT TERM GOAL #1   Title Independent with initial strengthening HEP   Status On-going     PT SHORT TERM GOAL #2   Title Berg >/= 50/56 to reduce risk for falls   Status On-going     PT SHORT TERM GOAL #3   Title Normal TUG </= 13.5 sec to increase safety with transitions and reduce risk for falls   Status On-going           PT Long Term Goals - 11/18/16 0801      PT LONG TERM GOAL #1   Title Independent with ongoing strengthening +/- balance HEP/gym program    Status On-going     PT LONG TERM GOAL #2   Title Overall R LE strength >/= 4/5 and L LE strength >/= 4-/5 for improved stability   Status On-going      PT LONG TERM GOAL #3   Title Gait speed w/o AD >/= 3.0 ft/sec to increase safety with community ambulation   Status On-going     PT LONG TERM GOAL #4   Title FGA >/= 19/30 to decrease risk for falls   Status On-going               Plan - 11/18/16 0801    Clinical Impression Statement Ryan doing well today no new complaints.  Session focusing on review of previously issued [place] PT HEP and creation of basic LE strengthening HEP.  HEP and red TB issued to pt. with pt. able to perform all activities without issue.  Will monitor response in upcoming visit.   PT Treatment/Interventions Patient/family education;Neuromuscular re-education;Balance training;Therapeutic exercise;Therapeutic activities;Functional mobility training;Gait training;Stair training;ADLs/Self Care Home Management      Patient will benefit from skilled therapeutic intervention in order to improve the following deficits and impairments:  Decreased strength, Decreased balance, Decreased coordination, Difficulty walking, Abnormal gait  Visit Diagnosis: Hemiplegia and hemiparesis following cerebral infarction affecting left non-dominant side (HCC)  Unsteadiness on feet  Other abnormalities of gait and mobility  Difficulty in walking, not elsewhere classified  Muscle weakness (generalized)     Problem List Patient Active Problem List   Diagnosis Date Noted  . 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 Left-sided weakness 08/24/2016  . Cerebral embolism with cerebral infarction 08/24/2016  . Diabetes mellitus without complication ([place])   . Essential hypertension   . Hyperlipidemia   . Anemia   . Acute ischemic stroke ([place])   . Diabetes mellitus with complication [facility])    Bess Harvest, PTA 11/18/16 10:18 AM  [facility] [address]  [place], [place], [address] Phone: [phone]   Fax:   [phone]  Name: SERIAH BROTZMAN MRN: [id number] Date of Birth: 1950/03/28

## 2016-11-20 ENCOUNTER — Ambulatory Visit: Payer: Medicare Other

## 2016-11-20 DIAGNOSIS — R2681 Unsteadiness on feet: Secondary | ICD-10-CM | POA: Diagnosis not present

## 2016-11-20 DIAGNOSIS — R2689 Other abnormalities of gait and mobility: Secondary | ICD-10-CM | POA: Diagnosis not present

## 2016-11-20 DIAGNOSIS — M6281 Muscle weakness (generalized): Secondary | ICD-10-CM | POA: Diagnosis not present

## 2016-11-20 DIAGNOSIS — R262 Difficulty in walking, not elsewhere classified: Secondary | ICD-10-CM | POA: Diagnosis not present

## 2016-11-20 DIAGNOSIS — I69354 Hemiplegia and hemiparesis following cerebral infarction affecting left non-dominant side: Secondary | ICD-10-CM

## 2016-11-20 NOTE — Therapy (Signed)
[facility] [address]  [place], [place], [address] Phone: [phone]   Fax:  [phone]  Physical Therapy Treatment  Patient Details  Name: Jenny Kelley MRN: [id number] Date of Birth: 03/20/1950 Referring Provider: Rosalin Hawking, MD  Encounter Date: 11/20/2016      PT End of Session - 11/20/16 0806    Visit Number 3   Number of Visits 16   Date for PT Re-Evaluation 01/08/17   Authorization Type UHC Medicare   PT Start Time [phone]  pt. arrived late    PT Stop Time 0845   PT Time Calculation (min) 38 min   Activity Tolerance Patient tolerated treatment well   Behavior During Therapy [facility] for tasks assessed/performed      Past Medical History:  Diagnosis Date  . Anemia   . Arthritis    "left knee" (08/24/2016)  . Colon cancer ([place])   . Headache    "sometimes monthly" (08/24/2016)  . History of blood transfusion ~ 1994   "related to colon cancer"  . HTN (hypertension)   . Hyperlipidemia   . Left-sided weakness 08/24/2016  . Stroke ([place]) 08/24/2016   "Left-sided weakness since" (08/24/2016)  . Type II diabetes mellitus ([place])     Past Surgical History:  Procedure Laterality Date  . ABDOMINAL HYSTERECTOMY     "partial"  . CATARACT EXTRACTION W/ INTRAOCULAR LENS  IMPLANT, BILATERAL Bilateral ~ 2015  . COLECTOMY  ~ 1994   "colon cancer"    There were no vitals filed for this visit.      Subjective Assessment - 11/20/16 0810    Subjective Pt. reporting she wishes to review one of the ankle HEP activities due to not being clear on technique.   Patient Stated Goals "to get stronger and better balanced"   Currently in Pain? No/denies   Multiple Pain Sites No                         [facility] Adult PT Treatment/Exercise - 11/20/16 5366      Neuro Re-ed    Neuro Re-ed Details  Tandem walk forward/backward holding onto TM (~10 ft) x 3 laps; supervision     Knee/Hip Exercises: Aerobic   Nustep Lvl 5, 6 min      Knee/Hip Exercises: Standing   Heel Raises 2 sets;10 reps   Heel Raises Limitations holding onto TM    Hip Flexion 10 reps;Knee bent;Right;Left   Hip Flexion Limitations 2#   Hip Abduction 10 reps;Knee straight;Right;Left   Abduction Limitations 2#   Hip Extension 10 reps;Knee straight;Right;Left   Extension Limitations 2#   Other Standing Knee Exercises L step lunge onto airex with cross-over UE reach to cone on bolster x 15 reps; therapist supervision   Other Standing Knee Exercises L HS curl holding onto TM x 10 reps     Knee/Hip Exercises: Seated   Long Arc Quad Both;10 reps;Weights   Long Arc Quad Weight 2 lbs.   Hamstring Curl 10 reps;Left;1 set;Strengthening   Hamstring Limitations red TB    Sit to Sand 10 reps;with UE support;without UE support;1 set  R LE on foam oval                 PT Education - 11/20/16 1308    Education provided Yes   Education Details standing HS curl    Person(s) Educated Patient   Methods Explanation;Demonstration;Verbal cues;Handout   Comprehension Verbalized  understanding;Returned demonstration;Verbal cues required;Need further instruction          PT Short Term Goals - 11/18/16 0801      PT SHORT TERM GOAL #1   Title Independent with initial strengthening HEP   Status On-going     PT SHORT TERM GOAL #2   Title Berg >/= 50/56 to reduce risk for falls   Status On-going     PT SHORT TERM GOAL #3   Title Normal TUG </= 13.5 sec to increase safety with transitions and reduce risk for falls   Status On-going           PT Long Term Goals - 11/18/16 0801      PT LONG TERM GOAL #1   Title Independent with ongoing strengthening +/- balance HEP/gym program    Status On-going     PT LONG TERM GOAL #2   Title Overall R LE strength >/= 4/5 and L LE strength >/= 4-/5 for improved stability   Status On-going     PT LONG TERM GOAL #3   Title Gait speed w/o AD >/= 3.0 ft/sec to increase safety with  community ambulation   Status On-going     PT LONG TERM GOAL #4   Title FGA >/= 19/30 to decrease risk for falls   Status On-going               Plan - 11/20/16 1355    Clinical Impression Statement [name] reporting HEP going well with exception of ankle IV with red band.  Pt. unclear on proper setup for this activity however verbalized understanding and able to return demonstration following review.  Tolerated additional standing activities well today focused on promoting L LE wt. bearing.  Standing HS curl added to HEP via handout today with pt. able to perform without issue.   PT Treatment/Interventions Patient/family education;Neuromuscular re-education;Balance training;Therapeutic exercise;Therapeutic activities;Functional mobility training;Gait training;Stair training;ADLs/Self Care Home Management      Patient will benefit from skilled therapeutic intervention in order to improve the following deficits and impairments:  Decreased strength, Decreased balance, Decreased coordination, Difficulty walking, Abnormal gait  Visit Diagnosis: Hemiplegia and hemiparesis following cerebral infarction affecting left non-dominant side (HCC)  Unsteadiness on feet  Other abnormalities of gait and mobility  Difficulty in walking, not elsewhere classified  Muscle weakness (generalized)     Problem List Patient Active Problem List   Diagnosis Date Noted  . 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 Left-sided weakness 08/24/2016  . Cerebral embolism with cerebral infarction 08/24/2016  . Diabetes mellitus without complication ([place])   . Essential hypertension   . Hyperlipidemia   . Anemia   . Acute ischemic stroke ([place])   . Diabetes mellitus with complication [facility])    Bess Harvest, PTA 11/20/16 1:57 PM  [facility] [address]  [place], [place], [address] Phone:  [phone]   Fax:  [phone]  Name: Jenny Kelley MRN: [id number] Date of Birth: [birth year]

## 2016-11-25 ENCOUNTER — Ambulatory Visit: Payer: Medicare Other

## 2016-11-25 DIAGNOSIS — R262 Difficulty in walking, not elsewhere classified: Secondary | ICD-10-CM | POA: Diagnosis not present

## 2016-11-25 DIAGNOSIS — R2689 Other abnormalities of gait and mobility: Secondary | ICD-10-CM | POA: Diagnosis not present

## 2016-11-25 DIAGNOSIS — M6281 Muscle weakness (generalized): Secondary | ICD-10-CM | POA: Diagnosis not present

## 2016-11-25 DIAGNOSIS — R2681 Unsteadiness on feet: Secondary | ICD-10-CM

## 2016-11-25 DIAGNOSIS — I69354 Hemiplegia and hemiparesis following cerebral infarction affecting left non-dominant side: Secondary | ICD-10-CM | POA: Diagnosis not present

## 2016-11-25 NOTE — Therapy (Signed)
[facility] [address]  [place], [place], [address] Phone: [phone]   Fax:  [phone]  Physical Therapy Treatment  Patient Details  Name: JADIA CAPERS MRN: [id number] Date of Birth: 1950/10/26 Referring Provider: Rosalin Hawking, MD  Encounter Date: 11/25/2016      PT End of Session - 11/25/16 0804    Visit Number 4   Number of Visits 16   Date for PT Re-Evaluation 01/08/17   Authorization Type UHC Medicare   PT Start Time 0758   PT Stop Time 0841   PT Time Calculation (min) 43 min   Activity Tolerance Patient tolerated treatment well   Behavior During Therapy [facility] for tasks assessed/performed      Past Medical History:  Diagnosis Date  . Anemia   . Arthritis    "left knee" (08/24/2016)  . Colon cancer ([place])   . Headache    "sometimes monthly" (08/24/2016)  . History of blood transfusion ~ 1994   "related to colon cancer"  . HTN (hypertension)   . Hyperlipidemia   . Left-sided weakness 08/24/2016  . Stroke ([place]) 08/24/2016   "Left-sided weakness since" (08/24/2016)  . Type II diabetes mellitus ([place])     Past Surgical History:  Procedure Laterality Date  . ABDOMINAL HYSTERECTOMY     "partial"  . CATARACT EXTRACTION W/ INTRAOCULAR LENS  IMPLANT, BILATERAL Bilateral ~ 2015  . COLECTOMY  ~ 1994   "colon cancer"    There were no vitals filed for this visit.      Subjective Assessment - 11/25/16 0802    Subjective Pt. feels like she "overdid it" over weekend with "a lot of walking".  Reports some HEP activities getting easier    Patient Stated Goals "to get stronger and better balanced"   Currently in Pain? No/denies   Multiple Pain Sites No                         [facility] Adult PT Treatment/Exercise - 11/25/16 0812      Neuro Re-ed    Neuro Re-ed Details  with 2 HH assist from therapist: tandem walk, side-step march, high knee march 2 x 30 ft each       Knee/Hip Exercises:  Aerobic   Nustep Lvl 5, 4 min      Knee/Hip Exercises: Standing   Knee Flexion Right;Left;10 reps   Knee Flexion Limitations 2# on R ankle    Hip Flexion 10 reps;Knee bent;Right;Left   Hip Flexion Limitations 2#   Hip Abduction 10 reps;Knee straight;Right;Left   Abduction Limitations 2#   Hip Extension 10 reps;Knee straight;Right;Left   Extension Limitations 2#   Forward Step Up 10 reps;Left;Step Height: 4";Hand Hold: 1   Forward Step Up Limitations SPC; supervision    Other Standing Knee Exercises side-stepping march with 2# at ankles 4 x 15 ft at counter; light support      Knee/Hip Exercises: Seated   Ball Squeeze 5" x 10 reps    Other Seated Knee/Hip Exercises Fitter L leg press (1 black band)  x 15 reps   Marching 2 sets;Both;10 reps  performed to check tolerance for yellow TB   Marching Limitations yellow looped TB at knees   Yellow TB issued to pt.    Sit to Sand with UE support;1 set;15 reps  pushoff from knees; R LE on foam oval      Knee/Hip Exercises: Supine   Bridges with  Ball Squeeze 10 reps;Both                PT Education - 11/25/16 [phone]    Education provided Yes   Education Details standing hip extension, flexion (bent knee), abduction, bridge with adduction squeeze, yellow looped TB issued to pt. for seated march   Person(s) Educated Patient   Methods Explanation;Demonstration;Verbal cues;Handout   Comprehension Verbalized understanding;Returned demonstration;Verbal cues required;Need further instruction          PT Short Term Goals - 11/18/16 0801      PT SHORT TERM GOAL #1   Title Independent with initial strengthening HEP   Status On-going     PT SHORT TERM GOAL #2   Title Berg >/= 50/56 to reduce risk for falls   Status On-going     PT SHORT TERM GOAL #3   Title Normal TUG </= 13.5 sec to increase safety with transitions and reduce risk for falls   Status On-going           PT Long Term Goals - 11/18/16 0801      PT LONG TERM  GOAL #1   Title Independent with ongoing strengthening +/- balance HEP/gym program    Status On-going     PT LONG TERM GOAL #2   Title Overall R LE strength >/= 4/5 and L LE strength >/= 4-/5 for improved stability   Status On-going     PT LONG TERM GOAL #3   Title Gait speed w/o AD >/= 3.0 ft/sec to increase safety with community ambulation   Status On-going     PT LONG TERM GOAL #4   Title FGA >/= 19/30 to decrease risk for falls   Status On-going               Plan - 11/25/16 1002    Clinical Impression Statement Halee reporting HEP going well now without issues.  Feels seated march getting easier thus yellow looped TB issued to pt. for added challenge.  Progressed to more standing strengthening and dynamic gait activities today with pt. having most difficulty with tandem walk.  Pt. tolerated hip 3-direction SLR kick out well thus this and bridge/adduction squeeze added to HEP to continue to target LE weaknesses.  Pt. reporting no recent falls and she feels her walking speed is increasing.  Jandy continues to report daily adherence to strengthening HEP and encouraged to alternate updated HEP with previously issued HEP as she now has additional activities.     PT Treatment/Interventions Patient/family education;Neuromuscular re-education;Balance training;Therapeutic exercise;Therapeutic activities;Functional mobility training;Gait training;Stair training;ADLs/Self Care Home Management      Patient will benefit from skilled therapeutic intervention in order to improve the following deficits and impairments:  Decreased strength, Decreased balance, Decreased coordination, Difficulty walking, Abnormal gait  Visit Diagnosis: Hemiplegia and hemiparesis following cerebral infarction affecting left non-dominant side (HCC)  Unsteadiness on feet  Other abnormalities of gait and mobility  Difficulty in walking, not elsewhere classified  Muscle weakness  (generalized)     Problem List Patient Active Problem List   Diagnosis Date Noted  . 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 Left-sided weakness 08/24/2016  . Cerebral embolism with cerebral infarction 08/24/2016  . Diabetes mellitus without complication ([place])   . Essential hypertension   . Hyperlipidemia   . Anemia   . Acute ischemic stroke ([place])   . Diabetes mellitus with complication [facility])     Bess Harvest, PTA 11/25/16 10:39 AM  [facility] [address]  [place], [place], [address] Phone: [phone]   Fax:  [phone]  Name: DENI BERTI MRN: [id number] Date of Birth: [birth year]

## 2016-11-27 ENCOUNTER — Ambulatory Visit: Payer: Medicare Other | Admitting: Physical Therapy

## 2016-12-02 ENCOUNTER — Ambulatory Visit: Payer: Medicare Other | Attending: Neurology | Admitting: Physical Therapy

## 2016-12-02 ENCOUNTER — Encounter: Payer: Self-pay | Admitting: Physical Therapy

## 2016-12-02 DIAGNOSIS — R2689 Other abnormalities of gait and mobility: Secondary | ICD-10-CM | POA: Insufficient documentation

## 2016-12-02 DIAGNOSIS — R262 Difficulty in walking, not elsewhere classified: Secondary | ICD-10-CM

## 2016-12-02 DIAGNOSIS — M6281 Muscle weakness (generalized): Secondary | ICD-10-CM | POA: Diagnosis present

## 2016-12-02 DIAGNOSIS — R2681 Unsteadiness on feet: Secondary | ICD-10-CM | POA: Diagnosis present

## 2016-12-02 DIAGNOSIS — I69354 Hemiplegia and hemiparesis following cerebral infarction affecting left non-dominant side: Secondary | ICD-10-CM | POA: Insufficient documentation

## 2016-12-02 NOTE — Therapy (Signed)
[facility] [address]  [place], [place], [address] Phone: [phone]   Fax:  [phone]  Physical Therapy Treatment  Patient Details  Name: Jenny Kelley MRN: [id number] Date of Birth: 1950-12-24 Referring Provider: Rosalin Hawking, MD   Encounter Date: 12/02/2016  PT End of Session - 12/02/16 0802    Visit Number  4    Number of Visits  16    Date for PT Re-Evaluation  01/08/17    Authorization Type  UHC Medicare    PT Start Time  0802    PT Stop Time  0844    PT Time Calculation (min)  42 min    Activity Tolerance  Patient tolerated treatment well    Behavior During Therapy  [facility] for tasks assessed/performed       Past Medical History:  Diagnosis Date  . Anemia   . Arthritis    "left knee" (08/24/2016)  . Colon cancer ([place])   . Headache    "sometimes monthly" (08/24/2016)  . History of blood transfusion ~ 1994   "related to colon cancer"  . HTN (hypertension)   . Hyperlipidemia   . Left-sided weakness 08/24/2016  . Stroke ([place]) 08/24/2016   "Left-sided weakness since" (08/24/2016)  . Type II diabetes mellitus ([place])     Past Surgical History:  Procedure Laterality Date  . ABDOMINAL HYSTERECTOMY     "partial"  . CATARACT EXTRACTION W/ INTRAOCULAR LENS  IMPLANT, BILATERAL Bilateral ~ 2015  . COLECTOMY  ~ 1994   "colon cancer"    There were no vitals filed for this visit.  Subjective Assessment - 12/02/16 0806    Subjective  Pt reports things are going good with her HEP. No complaints today.    Patient Stated Goals  "to get stronger and better balanced"    Currently in Pain?  No/denies                      [facility] Adult PT Treatment/Exercise - 12/02/16 0802      Exercises   Exercises  Knee/Hip      Knee/Hip Exercises: Aerobic   Nustep  lvl 4 x 6' (LE only)      Knee/Hip Exercises: Standing   Heel Raises  Both;20 reps    Heel Raises Limitations  UE support on counter    Side  Lunges  Both;10 reps;3 seconds    Side Lunges Limitations  TRX - difficulty with wider step resulting in B knee bending    Functional Squat  15 reps;3 seconds    Functional Squat Limitations  counter squat      Knee/Hip Exercises: Seated   Other Seated Knee/Hip Exercises  Fitter leg press (1 black) x15 each leg      Knee/Hip Exercises: Supine   Bridges with Clamshell  Both;10 reps + hip ABD isometric with red TB   + hip ABD isometric with red TB     Knee/Hip Exercises: Sidelying   Clams  B clam with red TB x10          Balance Exercises - 12/02/16 0802      Balance Exercises: Standing   Rebounder  Double leg;20 reps lat, fwd/back & B staggered wt shift; marching in place   lat, fwd/back & B staggered wt shift; marching in place         PT Short Term Goals - 12/02/16 0808      PT SHORT TERM GOAL #  1   Title  Independent with initial strengthening HEP    Status  Achieved      PT SHORT TERM GOAL #2   Title  Berg >/= 50/56 to reduce risk for falls    Status  On-going      PT SHORT TERM GOAL #3   Title  Normal TUG </= 13.5 sec to increase safety with transitions and reduce risk for falls    Status  On-going        PT Long Term Goals - 11/18/16 0801      PT LONG TERM GOAL #1   Title  Independent with ongoing strengthening +/- balance HEP/gym program     Status  On-going      PT LONG TERM GOAL #2   Title  Overall R LE strength >/= 4/5 and L LE strength >/= 4-/5 for improved stability    Status  On-going      PT LONG TERM GOAL #3   Title  Gait speed w/o AD >/= 3.0 ft/sec to increase safety with community ambulation    Status  On-going      PT LONG TERM GOAL #4   Title  FGA >/= 19/30 to decrease risk for falls    Status  On-going            Plan - 12/02/16 0808    Clinical Impression Statement  Pt denies any issues with HEP, therefore progressed therapeutic exercises to include more closed chain strengthening and introduced balance activities targeting  weight shifting on compliant surfaces. Pt noting some fatigue as expected with exercises but otherwise no issues.    Rehab Potential  Good    PT Treatment/Interventions  Patient/family education;Neuromuscular re-education;Balance training;Therapeutic exercise;Therapeutic activities;Functional mobility training;Gait training;Stair training;ADLs/Self Care Home Management    Consulted and Agree with Plan of Care  Patient       Patient will benefit from skilled therapeutic intervention in order to improve the following deficits and impairments:  Decreased strength, Decreased balance, Decreased coordination, Difficulty walking, Abnormal gait  Visit Diagnosis: Hemiplegia and hemiparesis following cerebral infarction affecting left non-dominant side (HCC)  Unsteadiness on feet  Other abnormalities of gait and mobility  Difficulty in walking, not elsewhere classified  Muscle weakness (generalized)     Problem List Patient Active Problem List   Diagnosis Date Noted  . 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 Left-sided weakness 08/24/2016  . Cerebral embolism with cerebral infarction 08/24/2016  . Diabetes mellitus without complication ([place])   . Essential hypertension   . Hyperlipidemia   . Anemia   . Acute ischemic stroke ([place])   . Diabetes mellitus with complication [facility])     Percival Spanish, PT, MPT 12/02/2016, 8:45 AM  [facility] [address]  [place], [place], [address] Phone: [phone]   Fax:  [phone]  Name: Jenny Kelley MRN: [id number] Date of Birth: Oct 10, 1950

## 2016-12-04 ENCOUNTER — Ambulatory Visit: Payer: Medicare Other | Admitting: Physical Therapy

## 2016-12-09 ENCOUNTER — Ambulatory Visit: Payer: Medicare Other

## 2016-12-09 DIAGNOSIS — M6281 Muscle weakness (generalized): Secondary | ICD-10-CM

## 2016-12-09 DIAGNOSIS — R262 Difficulty in walking, not elsewhere classified: Secondary | ICD-10-CM

## 2016-12-09 DIAGNOSIS — R2681 Unsteadiness on feet: Secondary | ICD-10-CM

## 2016-12-09 DIAGNOSIS — R2689 Other abnormalities of gait and mobility: Secondary | ICD-10-CM

## 2016-12-09 DIAGNOSIS — I69354 Hemiplegia and hemiparesis following cerebral infarction affecting left non-dominant side: Secondary | ICD-10-CM

## 2016-12-09 NOTE — Therapy (Addendum)
[facility] [address]  [place], [place], [address] Phone: [phone]   Fax:  [phone]  Physical Therapy Treatment  Patient Details  Name: Jenny Kelley MRN: [id number] Date of Birth: Sep 24, 1950 Referring Provider: Rosalin Hawking, MD   Encounter Date: 12/09/2016  PT End of Session - 12/09/16 0808    Visit Number  6 visit count corrected from last visit     Number of Visits  16    Date for PT Re-Evaluation  01/08/17    Authorization Type  UHC Medicare    PT Start Time  0801    PT Stop Time  0844    PT Time Calculation (min)  43 min    Activity Tolerance  Patient tolerated treatment well    Behavior During Therapy  [facility] for tasks assessed/performed       Past Medical History:  Diagnosis Date  . Anemia   . Arthritis    "left knee" (08/24/2016)  . Colon cancer ([place])   . Headache    "sometimes monthly" (08/24/2016)  . History of blood transfusion ~ 1994   "related to colon cancer"  . HTN (hypertension)   . Hyperlipidemia   . Left-sided weakness 08/24/2016  . Stroke ([place]) 08/24/2016   "Left-sided weakness since" (08/24/2016)  . Type II diabetes mellitus ([place])     Past Surgical History:  Procedure Laterality Date  . ABDOMINAL HYSTERECTOMY     "partial"  . CATARACT EXTRACTION W/ INTRAOCULAR LENS  IMPLANT, BILATERAL Bilateral ~ 2015  . COLECTOMY  ~ 1994   "colon cancer"    There were no vitals filed for this visit.  Subjective Assessment - 12/09/16 0806    Subjective  No new complaints pt. doing well today.      Patient Stated Goals  "to get stronger and better balanced"    Currently in Pain?  No/denies    Multiple Pain Sites  No                      [facility] Adult PT Treatment/Exercise - 12/09/16 0817      Neuro Re-ed    Neuro Re-ed Details   Tandem forward/backwards walk at counter x 3 laps supervision provided;       Knee/Hip Exercises: Aerobic   Stationary Bike  Lvl 1, 7 min        Knee/Hip Exercises: Machines for Strengthening   Cybex Leg Press  B LE's 20# 2 x 15 reps      Knee/Hip Exercises: Standing   Hip Flexion  10 reps;Knee bent;Right;Left    Hip Flexion Limitations  yellow looped TB at ankles yellow TB added to HEP; chair support     Hip Abduction  10 reps;Knee straight;Right;Left    Abduction Limitations  yellow TB at ankles  chair support     Hip Extension  10 reps;Knee straight;Right;Left    Extension Limitations  yellow looped TB at ankles chair support     SLS  B SLS 3 x 20 sec each LE; 1 light UE support on counter       Knee/Hip Exercises: Seated   Other Seated Knee/Hip Exercises  B Fitter leg press (1 black, blue band) 2 x 10 each leg             PT Education - 12/09/16 1154    Education provided  Yes    Education Details  counter squat with chair behind  Person(s) Educated  Patient    Methods  Explanation;Demonstration;Verbal cues;Handout    Comprehension  Verbalized understanding;Returned demonstration;Verbal cues required;Need further instruction       PT Short Term Goals - 12/02/16 9147      PT SHORT TERM GOAL #1   Title  Independent with initial strengthening HEP    Status  Achieved      PT SHORT TERM GOAL #2   Title  Berg >/= 50/56 to reduce risk for falls    Status  On-going      PT SHORT TERM GOAL #3   Title  Normal TUG </= 13.5 sec to increase safety with transitions and reduce risk for falls    Status  On-going        PT Long Term Goals - 11/18/16 0801      PT LONG TERM GOAL #1   Title  Independent with ongoing strengthening +/- balance HEP/gym program     Status  On-going      PT LONG TERM GOAL #2   Title  Overall R LE strength >/= 4/5 and L LE strength >/= 4-/5 for improved stability    Status  On-going      PT LONG TERM GOAL #3   Title  Gait speed w/o AD >/= 3.0 ft/sec to increase safety with community ambulation    Status  On-going      PT LONG TERM GOAL #4   Title  FGA >/= 19/30 to decrease  risk for falls    Status  On-going            Plan - 12/09/16 0808    Clinical Impression Statement  [place] with no new complaints today reporting she feels she is getting stronger with therapy.  Tolerated all narrow BOS balance and strengthening activities well today with addition of leg press and counter squat ~ 90 dg knee flexion to improve pt. ability to rise from low surfaces.  HEP updated with counter squat with instruction for pt. to place chair behind her for target with squat.  Glori progressing toward goals well.      PT Treatment/Interventions  Patient/family education;Neuromuscular re-education;Balance training;Therapeutic exercise;Therapeutic activities;Functional mobility training;Gait training;Stair training;ADLs/Self Care Home Management       Patient will benefit from skilled therapeutic intervention in order to improve the following deficits and impairments:  Decreased strength, Decreased balance, Decreased coordination, Difficulty walking, Abnormal gait  Visit Diagnosis: Hemiplegia and hemiparesis following cerebral infarction affecting left non-dominant side (HCC)  Unsteadiness on feet  Other abnormalities of gait and mobility  Difficulty in walking, not elsewhere classified  Muscle weakness (generalized)     Problem List Patient Active Problem List   Diagnosis Date Noted  . 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 Left-sided weakness 08/24/2016  . Cerebral embolism with cerebral infarction 08/24/2016  . Diabetes mellitus without complication ([place])   . Essential hypertension   . Hyperlipidemia   . Anemia   . Acute ischemic stroke ([place])   . Diabetes mellitus with complication [facility])     Bess Harvest, PTA 12/09/16 11:55 AM  [facility] [address]  [place], [place], [address] Phone: [phone]   Fax:  [phone]  Name: YANNA LEAKS MRN: [id number] Date of Birth: 06-Jun-1950  PHYSICAL THERAPY DISCHARGE SUMMARY  Visits from Start of Care: 6  Current functional level related to goals / functional outcomes:   Unable to formally  assess as pt called to cancel all further appts w/o reason given.   Remaining deficits:   As above   Education / Equipment:   HEP   G-Codes - 01-03-17 1652    Functional Assessment Tool Used (Outpatient Only) clinical judgement   Functional Limitation Mobility: Walking and moving around   Mobility: Walking and Moving Around Goal Status [phone]) At least 20 percent but less than 40 percent impaired, limited or restricted   Mobility: Walking and Moving Around DischargeGoal Status [phone]) At least 20 percent but less than 40 percent impaired, limited or restricted      Plan: Patient agrees to discharge.  Patient goals were partially met. Patient is being discharged due to the patient's request.  ?????    Percival Spanish, PT, MPT 01/05/17, 3:43 PM  [facility] [address]  [place], [place], [address] Phone: [phone]   Fax:  [phone]

## 2016-12-11 ENCOUNTER — Ambulatory Visit: Payer: Medicare Other

## 2016-12-16 ENCOUNTER — Ambulatory Visit: Payer: Medicare Other

## 2016-12-18 ENCOUNTER — Ambulatory Visit: Payer: Medicare Other | Admitting: Physical Therapy

## 2016-12-30 ENCOUNTER — Ambulatory Visit: Payer: Medicare Other | Admitting: Physical Therapy

## 2017-01-01 ENCOUNTER — Ambulatory Visit: Payer: Medicare Other | Admitting: Physical Therapy

## 2017-02-09 NOTE — Progress Notes (Signed)
[facility]  PATIENT: Jenny Kelley DOB: [birth year]   REASON FOR VISIT: Follow-up for right anterior pontine infarct HISTORY FROM: Patient    HISTORY OF PRESENT ILLNESS:Jenny Kelley a 67 y.o.femalewith history of anemia, colon cancer, headache, hypertension, hyperlipidemia, and type II diabetes mellitus admitted on 08/24/16 for left-sided weakness and chest pain. 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 CTA head and neck showed right M2 and V1 stenosis and left ICA athero. EF 60-65%, UDS neg. LDL 99 and A1C 6.7. Her ASA changed to plavix and lipitor 40 continued. B12 was low at 287.    Interval History10/3/18 Dr. Erlinda Hong During the interval time, the patient has been doing well. Pt still has very mild left hand clumsiness and left leg weakness. Walk with cane. Home PT/OT has completed. Has not got any outpt therapy yet. BP today 145/80, however, at home her BP was 180s. She did not check BP daily and was encouraged to do so. She is on ramipril 10mg  daily.   UPDATE 1/16/2019CM Jenny Kelley, 67 year old female returns for follow-up with history of stroke August 24, 2016.  She is currently on Plavix for secondary stroke prevention without recurrent stroke or TIA symptoms.  She has minimal bruising and no bleeding.  Her occupational and physical therapy has concluded.  She still has some mild weakness left lower extremity.  She is ambulating with a cane long distances.  She denies any falls.  She is remains on Lipitor for hyperlipidemia without myalgias.  She checks her CBGs every day and they are fairly consistent around 168.  She also checks her blood pressures and keeps a log for her primary care.  Blood pressure in the office today 140/86.  She remains on ramipril 10 mg daily.  She likes pork and was cautioned against that as it can increase blood pressure.  She returns for reevaluation REVIEW OF SYSTEMS: Full 14 system review of systems performed and notable only for  those listed, all others are neg:  Constitutional: neg  Cardiovascular: neg Ear/Nose/Throat: neg  Skin: neg Eyes: neg Respiratory: neg Gastroitestinal: neg  Hematology/Lymphatic: neg  Endocrine: neg Musculoskeletal: Ambulates with cane Allergy/Immunology: neg Neurological: neg Psychiatric: neg Sleep : neg   ALLERGIES: No Known Allergies  HOME MEDICATIONS: Outpatient Medications Prior to Visit  Medication Sig Dispense Refill  . atorvastatin (LIPITOR) 40 MG tablet Take 2 tablets (80 mg total) by mouth daily. 60 tablet 0  . cholecalciferol (VITAMIN D) 1000 units tablet Take 1,000 Units by mouth daily.    . clopidogrel (PLAVIX) 75 MG tablet Take 1 tablet (75 mg total) by mouth daily. 30 tablet 0  . cyclobenzaprine (FLEXERIL) 5 MG tablet as needed.  0  . gabapentin (NEURONTIN) 100 MG capsule Take 200 mg by mouth at bedtime.    Marland Kitchen glimepiride (AMARYL) 4 MG tablet Take by mouth.    . metFORMIN (GLUCOPHAGE) 1000 MG tablet Take 1,000 mg by mouth 2 (two) times daily with a meal.    . ramipril (ALTACE) 10 MG capsule Take 10 mg by mouth daily.     No facility-administered medications prior to visit.     PAST MEDICAL HISTORY: Past Medical History:  Diagnosis Date  . Anemia   . Arthritis    "left knee" (08/24/2016)  . Colon cancer ([place])   . Headache    "sometimes monthly" (08/24/2016)  . History of blood transfusion ~ 1994   "related to colon cancer"  . HTN (hypertension)   . Hyperlipidemia   .  Left-sided weakness 08/24/2016  . Stroke ([place]) 08/24/2016   "Left-sided weakness since" (08/24/2016)  . Type II diabetes mellitus ([place])     PAST SURGICAL HISTORY: Past Surgical History:  Procedure Laterality Date  . ABDOMINAL HYSTERECTOMY     "partial"  . CATARACT EXTRACTION W/ INTRAOCULAR LENS  IMPLANT, BILATERAL Bilateral ~ 2015  . COLECTOMY  ~ 1994   "colon cancer"    FAMILY HISTORY: Family History  Problem Relation Age of Onset  . Cancer Mother        unknown type  .  Hypertension Father     SOCIAL HISTORY: Social History   Socioeconomic History  . Marital status: Married    Spouse name: Not on file  . Number of children: Not on file  . Years of education: Not on file  . Highest education level: Not on file  Social Needs  . Financial resource strain: Not on file  . Food insecurity - worry: Not on file  . Food insecurity - inability: Not on file  . Transportation needs - medical: Not on file  . Transportation needs - non-medical: Not on file  Occupational History  . Not on file  Tobacco Use  . Smoking status: Former Smoker    Years: 0.50    Last attempt to quit: 1970    Years since quitting: 49.0  . Smokeless tobacco: Never Used  Substance and Sexual Activity  . Alcohol use: No  . Drug use: No  . Sexual activity: Yes  Other Topics Concern  . Not on file  Social History Narrative  . Not on file     PHYSICAL EXAM  Vitals:   02/10/17 0831  BP: (!) 140/86   Pulse: 88  Weight: 184 lb 3.2 oz (83.6 kg)   Body mass index is 29.73 kg/m.  Generalized: Well developed, obese female in no acute distress  Head normocephalic and atraumatic,. Oropharynx benign  Neck: Supple, no carotid bruits  Cardiac: Regular rate rhythm, no murmur  Musculoskeletal: No deformity   Neurological examination   Mentation: Alert oriented to time, place, history taking. Attention span and concentration appropriate. Recent and remote memory intact.  Follows all commands speech and language fluent.   Cranial nerve II-XII: Fundoscopic exam reveals sharp disc margins.Pupils were equal round reactive to light extraocular movements were full, visual field were full on confrontational test. Facial sensation and strength were normal. hearing was intact to finger rubbing bilaterally. Uvula tongue midline. head turning and shoulder shrug were normal and symmetric.Tongue protrusion into cheek strength was normal. Motor: normal bulk and tone, full strength in the BUE,  BLE, except left lower extremity 4 out of 5  Sensory: normal and symmetric to light touch, pinprick, and  Vibration, in the upper and lower extremities Coordination: finger-nose-finger, heel-to-shin bilaterally, no dysmetria, no tremor Reflexes: 1+ upper lower and symmetric, plantar responses were flexor bilaterally. Gait and Station: Rising up from seated position without assistance, mild left hemiparetic gait, no assistive device DIAGNOSTIC DATA (LABS, IMAGING, TESTING) - I reviewed patient records, labs, notes, testing and imaging myself where available.  Lab Results  Component Value Date   WBC 5.2 09/15/2016   HGB 11.1 (L) 09/15/2016   HCT 35.7 (L) 09/15/2016   MCV 92.0 09/15/2016   PLT 326 09/15/2016      Component Value Date/Time   NA 139 09/15/2016 1137   K 3.5 09/15/2016 1137   CL 106 09/15/2016 1137   CO2 25 09/15/2016 1137   GLUCOSE 206 (H) 09/15/2016  1137   BUN 11 09/15/2016 1137   CREATININE 0.66 09/15/2016 1137   CALCIUM 9.5 09/15/2016 1137   PROT 7.1 08/24/2016 0831   ALBUMIN 4.2 08/24/2016 0831   AST 24 08/24/2016 0831   ALT 18 08/24/2016 0831   ALKPHOS 64 08/24/2016 0831   BILITOT 0.4 08/24/2016 0831   GFRNONAA >60 09/15/2016 1137   GFRAA >60 09/15/2016 1137   Lab Results  Component Value Date   CHOL 190 08/24/2016   HDL 67 08/24/2016   LDLCALC 99 08/24/2016   TRIG 121 08/24/2016   CHOLHDL 2.8 08/24/2016   Lab Results  Component Value Date   HGBA1C 6.7 (H) 08/24/2016   Lab Results  Component Value Date   VITAMINB12 287 08/24/2016      ASSESSMENT AND PLAN   67 y.o. African American female with PMH of anemia, colon cancer, headache, hypertension, hyperlipidemia, and type II diabetes mellitus admitted on 08/24/16 for left-sided weakness and chest pain. 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 CTA head and neck showed right M2 and V1 stenosis and left ICA athero. EF 60-65%, UDS neg. LDL 99 and A1C 6.7. Her ASA changed to plavix and lipitor  40 continued. B12 was low at 287. Still has mild left LE weakness. Finished home PT/OT. The patient is a current patient of Dr. Erlinda Hong  who is out of the office today . This note is sent to the work in doctor.    Plan:  Stressed the importance of management of risk factors to prevent further stroke Continue Plavix for secondary stroke prevention Maintain strict control of hypertension with blood pressure goal below 130/90, today's reading 140/86 continue antihypertensive medications Control of diabetes with hemoglobin A1c below 6.5 followed by primary care  continue diabetic medications Cholesterol with LDL cholesterol less than 70, followed by primary care,   continue statin drugs Lipitor  exercise by walking, at least 30 minutes daily use cane for safe ambulation   eat healthy diet with whole grains,  fresh fruits and vegetables, limit pork products Follow up with your primary care physician for stroke risk factor modification. Recommend maintain blood pressure goal <130/80, diabetes with hemoglobin A1c goal below 7.0% and lipids with LDL cholesterol goal below 70 mg/dL.  Follow-up in 6 months, will discharge if stable I spent 25 minutes in total face to face time with the patient more than 50% of which was spent counseling and coordination of care, reviewing test results reviewing medications and discussing and reviewing the diagnosis of stroke and management of risk factors.  Questions answered  Dennie Bible, [facility], [facility], APRN  [facility] [address], [place],  [address] [phone]

## 2017-02-10 ENCOUNTER — Encounter: Payer: Self-pay | Admitting: Nurse Practitioner

## 2017-02-10 ENCOUNTER — Ambulatory Visit: Payer: Medicare Other | Admitting: Nurse Practitioner

## 2017-02-10 VITALS — BP 140/86 | HR 88 | Wt 184.2 lb

## 2017-02-10 DIAGNOSIS — E7849 Other hyperlipidemia: Secondary | ICD-10-CM | POA: Diagnosis not present

## 2017-02-10 DIAGNOSIS — I10 Essential (primary) hypertension: Secondary | ICD-10-CM | POA: Diagnosis not present

## 2017-02-10 DIAGNOSIS — R531 Weakness: Secondary | ICD-10-CM

## 2017-02-10 DIAGNOSIS — I639 Cerebral infarction, unspecified: Secondary | ICD-10-CM | POA: Diagnosis not present

## 2017-02-10 NOTE — Progress Notes (Signed)
I have reviewed and agreed above plan. 

## 2017-02-10 NOTE — Patient Instructions (Signed)
Stressed the importance of management of risk factors to prevent further stroke Continue Plavix for secondary stroke prevention Maintain strict control of hypertension with blood pressure goal below 130/90, today's reading 140/86 continue antihypertensive medications Control of diabetes with hemoglobin A1c below 6.5 followed by primary care  continue diabetic medications Cholesterol with LDL cholesterol less than 70, followed by primary care,   continue statin drugs Lipitor  exercise by walking, at least 30 minutes daily   eat healthy diet with whole grains,  fresh fruits and vegetables Follow up with your primary care physician for stroke risk factor modification. Recommend maintain blood pressure goal <130/80, diabetes with hemoglobin A1c goal below 7.0% and lipids with LDL cholesterol goal below 70 mg/dL.  Follow-up in 6 months

## 2017-08-09 NOTE — Progress Notes (Signed)
[facility]  PATIENT: Jenny Kelley DOB: September 03, 1950   REASON FOR VISIT: Follow-up for right anterior pontine infarct July 2018 HISTORY FROM: Patient    HISTORY OF PRESENT ILLNESS:Jenny Kelley a 68 y.o.femalewith history of anemia, colon cancer, headache, hypertension, hyperlipidemia, and type II diabetes mellitus admitted on 08/24/16 for left-sided weakness and chest pain. 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 CTA head and neck showed right M2 and V1 stenosis and left ICA athero. EF 60-65%, UDS neg. LDL 99 and A1C 6.7. Her ASA changed to plavix and lipitor 40 continued. B12 was low at 287.    Interval History10/3/18 Dr. Erlinda Hong During the interval time, the patient has been doing well. Pt still has very mild left hand clumsiness and left leg weakness. Walk with cane. Home PT/OT has completed. Has not got any outpt therapy yet. BP today 145/80, however, at home her BP was 180s. She did not check BP daily and was encouraged to do so. She is on ramipril 10mg  daily.   UPDATE 1/16/2019CM Jenny Kelley, 67 year old female returns for follow-up with history of stroke August 24, 2016.  She is currently on Plavix for secondary stroke prevention without recurrent stroke or TIA symptoms.  She has minimal bruising and no bleeding.  Her occupational and physical therapy has concluded.  She still has some mild weakness left lower extremity.  She is ambulating with a cane long distances.  She denies any falls.  She is remains on Lipitor for hyperlipidemia without myalgias.  She checks her CBGs every day and they are fairly consistent around 168.  She also checks her blood pressures and keeps a log for her primary care.  Blood pressure in the office today 140/86.  She remains on ramipril 10 mg daily.  She likes pork and was cautioned against that as it can increase blood pressure.  She returns for reevaluation UPDATE 7/16/19CM  Jenny Kelley, 67 year old female returns for follow-up  with a history of stroke August 24, 2016.  She is currently on Plavix for secondary stroke prevention with minimal bruising no bleeding and no further stroke or TIA symptoms.  Blood pressure in the office today 137/80.  She remains on Lipitor without myalgias.  She had a lumpectomy in April 2019.  Most recent hemoglobin A1c 7.2 on Jun 18, 2017.  She continues to follow with primary care for secondary stroke prevention monitoring.  She denies any falls she no longer uses a cane.  She returns for reevaluation REVIEW OF SYSTEMS: Full 14 system review of systems performed and notable only for those listed, all others are neg:  Constitutional: neg  Cardiovascular: neg Ear/Nose/Throat: neg  Skin: neg Eyes: neg Respiratory: neg Gastroitestinal: neg  Hematology/Lymphatic: neg  Endocrine: neg Musculoskeletal: neg Allergy/Immunology: neg Neurological: neg Psychiatric: neg Sleep : neg   ALLERGIES: No Known Allergies  HOME MEDICATIONS: Outpatient Medications Prior to Visit  Medication Sig Dispense Refill  . atorvastatin (LIPITOR) 40 MG tablet Take 2 tablets (80 mg total) by mouth daily. 60 tablet 0  . cholecalciferol (VITAMIN D) 1000 units tablet Take 1,000 Units by mouth daily.    . clopidogrel (PLAVIX) 75 MG tablet Take 1 tablet (75 mg total) by mouth daily. 30 tablet 0  . cyclobenzaprine (FLEXERIL) 5 MG tablet as needed.  0  . gabapentin (NEURONTIN) 100 MG capsule Take 200 mg by mouth at bedtime.    Marland Kitchen glimepiride (AMARYL) 4 MG tablet Take by mouth.    . metFORMIN (GLUCOPHAGE) 1000 MG  tablet Take 1,000 mg by mouth 2 (two) times daily with a meal.    . ramipril (ALTACE) 10 MG capsule Take 10 mg by mouth daily.     No facility-administered medications prior to visit.     PAST MEDICAL HISTORY: Past Medical History:  Diagnosis Date  . Anemia   . Arthritis    "left knee" (08/24/2016)  . Colon cancer ([place])   . Headache    "sometimes monthly" (08/24/2016)  . History of blood transfusion ~  1994   "related to colon cancer"  . HTN (hypertension)   . Hyperlipidemia   . Left-sided weakness 08/24/2016  . Stroke ([place]) 08/24/2016   "Left-sided weakness since" (08/24/2016)  . Type II diabetes mellitus ([place])     PAST SURGICAL HISTORY: Past Surgical History:  Procedure Laterality Date  . ABDOMINAL HYSTERECTOMY     "partial"  . BREAST LUMPECTOMY Right   . CATARACT EXTRACTION W/ INTRAOCULAR LENS  IMPLANT, BILATERAL Bilateral ~ 2015  . COLECTOMY  ~ 1994   "colon cancer"    FAMILY HISTORY: Family History  Problem Relation Age of Onset  . Cancer Mother        unknown type  . Hypertension Father     SOCIAL HISTORY: Social History   Socioeconomic History  . Marital status: Married    Spouse name: Not on file  . Number of children: Not on file  . Years of education: Not on file  . Highest education level: Not on file  Occupational History  . Not on file  Social Needs  . Financial resource strain: Not on file  . Food insecurity:    Worry: Not on file    Inability: Not on file  . Transportation needs:    Medical: Not on file    Non-medical: Not on file  Tobacco Use  . Smoking status: Former Smoker    Years: 0.50    Last attempt to quit: 1970    Years since quitting: 49.5  . Smokeless tobacco: Never Used  Substance and Sexual Activity  . Alcohol use: No  . Drug use: No  . Sexual activity: Yes  Lifestyle  . Physical activity:    Days per week: Not on file    Minutes per session: Not on file  . Stress: Not on file  Relationships  . Social connections:    Talks on phone: Not on file    Gets together: Not on file    Attends religious service: Not on file    Active member of club or organization: Not on file    Attends meetings of clubs or organizations: Not on file    Relationship status: Not on file  . Intimate partner violence:    Fear of current or ex partner: Not on file    Emotionally abused: Not on file    Physically abused: Not on file    Forced  sexual activity: Not on file  Other Topics Concern  . Not on file  Social History Narrative  . Not on file     PHYSICAL EXAM  Vitals:   02/10/17 0831  BP: (!) 137/80    Pulse: 66  Weight: 184 lb 3.2 oz (83.6 kg)   Body mass index is 29.96 kg/m.  Generalized: Well developed, obese female in no acute distress  Head normocephalic and atraumatic,. Oropharynx benign  Neck: Supple, no carotid bruits  Cardiac: Regular rate rhythm, no murmur  Musculoskeletal: No deformity   Neurological examination   Mentation:  Alert oriented to time, place, history taking. Attention span and concentration appropriate. Recent and remote memory intact.  Follows all commands speech and language fluent.   Cranial nerve II-XII: Fundoscopic exam reveals sharp disc margins.Pupils were equal round reactive to light extraocular movements were full, visual field were full on confrontational test. Facial sensation and strength were normal. hearing was intact to finger rubbing bilaterally. Uvula tongue midline. head turning and shoulder shrug were normal and symmetric.Tongue protrusion into cheek strength was normal. Motor: normal bulk and tone, full strength in the BUE, BLE,   Sensory: normal and symmetric to light touch, pinprick, and  Vibration, in the upper and lower extremities Coordination: finger-nose-finger, heel-to-shin bilaterally, no dysmetria, no tremor Reflexes: 1+ upper lower and symmetric, plantar responses were flexor bilaterally. Gait and Station: Rising up from seated position without assistance, able to heel toe tandem without difficulty DIAGNOSTIC DATA (LABS, IMAGING, TESTING) - I reviewed patient records, labs, notes, testing and imaging myself where available.  Lab Results  Component Value Date   WBC 5.2 09/15/2016   HGB 11.1 (L) 09/15/2016   HCT 35.7 (L) 09/15/2016   MCV 92.0 09/15/2016   PLT 326 09/15/2016      Component Value Date/Time   NA 139 09/15/2016 1137   K 3.5  09/15/2016 1137   CL 106 09/15/2016 1137   CO2 25 09/15/2016 1137   GLUCOSE 206 (H) 09/15/2016 1137   BUN 11 09/15/2016 1137   CREATININE 0.66 09/15/2016 1137   CALCIUM 9.5 09/15/2016 1137   PROT 7.1 08/24/2016 0831   ALBUMIN 4.2 08/24/2016 0831   AST 24 08/24/2016 0831   ALT 18 08/24/2016 0831   ALKPHOS 64 08/24/2016 0831   BILITOT 0.4 08/24/2016 0831   GFRNONAA >60 09/15/2016 1137   GFRAA >60 09/15/2016 1137   Lab Results  Component Value Date   CHOL 190 08/24/2016   HDL 67 08/24/2016   LDLCALC 99 08/24/2016   TRIG 121 08/24/2016   CHOLHDL 2.8 08/24/2016   Lab Results  Component Value Date   HGBA1C 6.7 (H) 08/24/2016   Lab Results  Component Value Date   VITAMINB12 287 08/24/2016      ASSESSMENT AND PLAN   67 y.o. African American female with PMH of anemia, colon cancer, headache, hypertension, hyperlipidemia, and type II diabetes mellitus admitted on 08/24/16 for left-sided weakness and chest pain. 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 CTA head and neck showed right M2 and V1 stenosis and left ICA athero. EF 60-65%, UDS neg. LDL 99 and A1C 6.7. Her ASA changed to plavix and lipitor 40 continued. B12 was low at 287.  The patient is a current patient of Dr. Erlinda Hong  who is out of the office today . This note is sent to the work in doctor.    Plan:  Stressed the importance of management of risk factors to prevent further stroke Continue Plavix for secondary stroke prevention Maintain strict control of hypertension with blood pressure goal below 130/90, today's reading 137/80 continue antihypertensive medications Control of diabetes with hemoglobin A1c below 6.5 followed by primary care  continue diabetic medications, most recent hemoglobin A1c 7.2 Jun 18, 2017 Cholesterol with LDL cholesterol less than 70, followed by primary care,   continue statin drugs Lipitor  exercise by walking, at least 30 minutes daily    eat healthy diet with whole grains,  fresh fruits  and vegetables,  Follow up with your primary care physician for stroke risk factor modification. Recommend maintain blood pressure  goal <130/80, diabetes with hemoglobin A1c goal below 7.0% and lipids with LDL cholesterol goal below 70 mg/dL.  Will discharge from stroke clinic I spent 25 minutes in total face to face time with the patient more than 50% of which was spent counseling and coordination of care, reviewing test results reviewing medications and discussing and reviewing the diagnosis of stroke and management of risk factors.  Questions answered  Dennie Bible, [facility], [facility], APRN  [facility] [address], [place], [place] [address] [phone]

## 2017-08-10 ENCOUNTER — Ambulatory Visit (INDEPENDENT_AMBULATORY_CARE_PROVIDER_SITE_OTHER): Payer: Medicare Other | Admitting: Nurse Practitioner

## 2017-08-10 ENCOUNTER — Encounter: Payer: Self-pay | Admitting: Nurse Practitioner

## 2017-08-10 VITALS — BP 137/80 | HR 66 | Ht 66.0 in | Wt 185.6 lb

## 2017-08-10 DIAGNOSIS — I10 Essential (primary) hypertension: Secondary | ICD-10-CM

## 2017-08-10 DIAGNOSIS — E7849 Other hyperlipidemia: Secondary | ICD-10-CM

## 2017-08-10 DIAGNOSIS — Z8673 Personal history of transient ischemic attack (TIA), and cerebral infarction without residual deficits: Secondary | ICD-10-CM

## 2017-08-10 NOTE — Progress Notes (Signed)
I reviewed note and agree with plan.   Penni Bombard, MD 1/47/0929, 57:47 AM Certified in Neurology, Neurophysiology and Neuroimaging  [facility] [address], [employer] [place], [place] [address] [phone]

## 2017-08-10 NOTE — Patient Instructions (Addendum)
Stressed the importance of management of risk factors to prevent further stroke Continue Plavix for secondary stroke prevention Maintain strict control of hypertension with blood pressure goal below 130/90, today's reading 137/80 continue antihypertensive medications Control of diabetes with hemoglobin A1c below 6.5 followed by primary care  continue diabetic medications Cholesterol with LDL cholesterol less than 70, followed by primary care,   continue statin drugs Lipitor  exercise by walking, at least 30 minutes daily    eat healthy diet with whole grains,  fresh fruits and vegetables,  Follow up with your primary care physician for stroke risk factor modification. Recommend maintain blood pressure goal <130/80, diabetes with hemoglobin A1c goal below 7.0% and lipids with LDL cholesterol goal below 70 mg/dL.  Will discharge from stroke clinic  Stroke Prevention Some health problems and behaviors may make it more likely for you to have a stroke. Below are ways to lessen your risk of having a stroke.  Be active for at least 30 minutes on most or all days.  Do not smoke. Try not to be around others who smoke.  Do not drink too much alcohol. ? Do not have more than 2 drinks a day if you are a man. ? Do not have more than 1 drink a day if you are a woman and are not pregnant.  Eat healthy foods, such as fruits and vegetables. If you were put on a specific diet, follow the diet as told.  Keep your cholesterol levels under control through diet and medicines. Look for foods that are low in saturated fat, trans fat, cholesterol, and are high in fiber.  If you have diabetes, follow all diet plans and take your medicine as told.  Ask your doctor if you need treatment to lower your blood pressure. If you have high blood pressure (hypertension), follow all diet plans and take your medicine as told by your doctor.  If you are 37-15 years old, have your blood pressure checked every 3-5 years. If you  are age 44 or older, have your blood pressure checked every year.  Keep a healthy weight. Eat foods that are low in calories, salt, saturated fat, trans fat, and cholesterol.  Do not take drugs.  Avoid birth control pills, if this applies. Talk to your doctor about the risks of taking birth control pills.  Talk to your doctor if you have sleep problems (sleep apnea).  Take all medicine as told by your doctor. ? You may be told to take aspirin or blood thinner medicine. Take this medicine as told by your doctor. ? Understand your medicine instructions.  Make sure any other conditions you have are being taken care of.  Get help right away if:  You suddenly lose feeling (you feel numb) or have weakness in your face, arm, or leg.  Your face or eyelid hangs down to one side.  You suddenly feel confused.  You have trouble talking (aphasia) or understanding what people are saying.  You suddenly have trouble seeing in one or both eyes.  You suddenly have trouble walking.  You are dizzy.  You lose your balance or your movements are clumsy (uncoordinated).  You suddenly have a very bad headache and you do not know the cause.  You have new chest pain.  Your heart feels like it is fluttering or skipping a beat (irregular heartbeat). Do not wait to see if the symptoms above go away. Get help right away. Call your local emergency services (911 in [place] Do  not drive yourself to the hospital. This information is not intended to replace advice given to you by your health care provider. Make sure you discuss any questions you have with your health care provider. Document Released: 07/14/2011 Document Revised: 06/20/2015 Document Reviewed: 07/15/2012 Elsevier Interactive Patient Education  [employer].

## 2017-09-03 ENCOUNTER — Telehealth: Payer: Self-pay | Admitting: Internal Medicine

## 2017-09-03 NOTE — Telephone Encounter (Signed)
Spoke to Fowlkes from [facility] inquiring about whether Dr End is contracted w/[employer] [place].  I gave her our billing office phone number.

## 2017-09-03 NOTE — Telephone Encounter (Signed)
New message   Per [place] Meo wants if Dr. Saunders Revel is contracted w/[employer] [place]. Please advise.

## 2017-11-15 ENCOUNTER — Other Ambulatory Visit: Payer: Self-pay | Admitting: Family Medicine

## 2017-11-15 DIAGNOSIS — Z780 Asymptomatic menopausal state: Secondary | ICD-10-CM

## 2018-01-25 ENCOUNTER — Other Ambulatory Visit: Payer: Medicare Other

## 2018-03-01 ENCOUNTER — Ambulatory Visit
Admission: RE | Admit: 2018-03-01 | Discharge: 2018-03-01 | Disposition: A | Discharge status: Home / Self Care | Payer: Medicare Other | Source: Ambulatory Visit | Attending: Family Medicine | Admitting: Family Medicine

## 2018-03-01 DIAGNOSIS — Z780 Asymptomatic menopausal state: Secondary | ICD-10-CM

## 2018-06-25 IMAGING — DX DG ANKLE COMPLETE 3+V*L*
3 series · 3 of 3 positions shown · non-contrast
Comparison: None.

ADDENDUM:
CLINICAL DATA: Swelling and pain.

EXAM:
LEFT ANKLE COMPLETE - 3+ VIEW

[ankle ap]
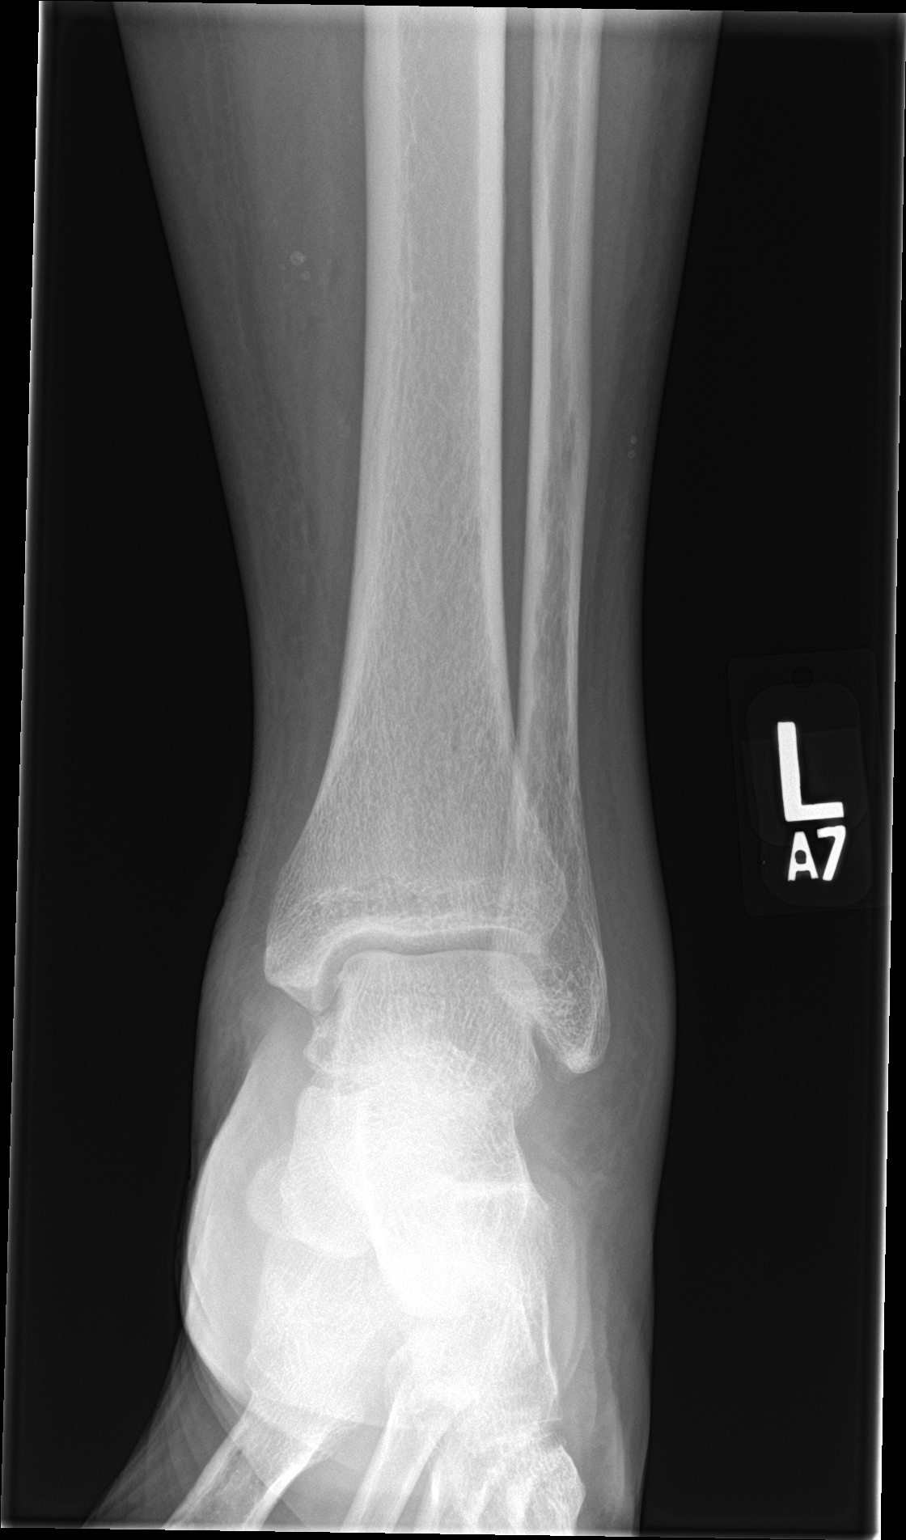

[ankle obl]
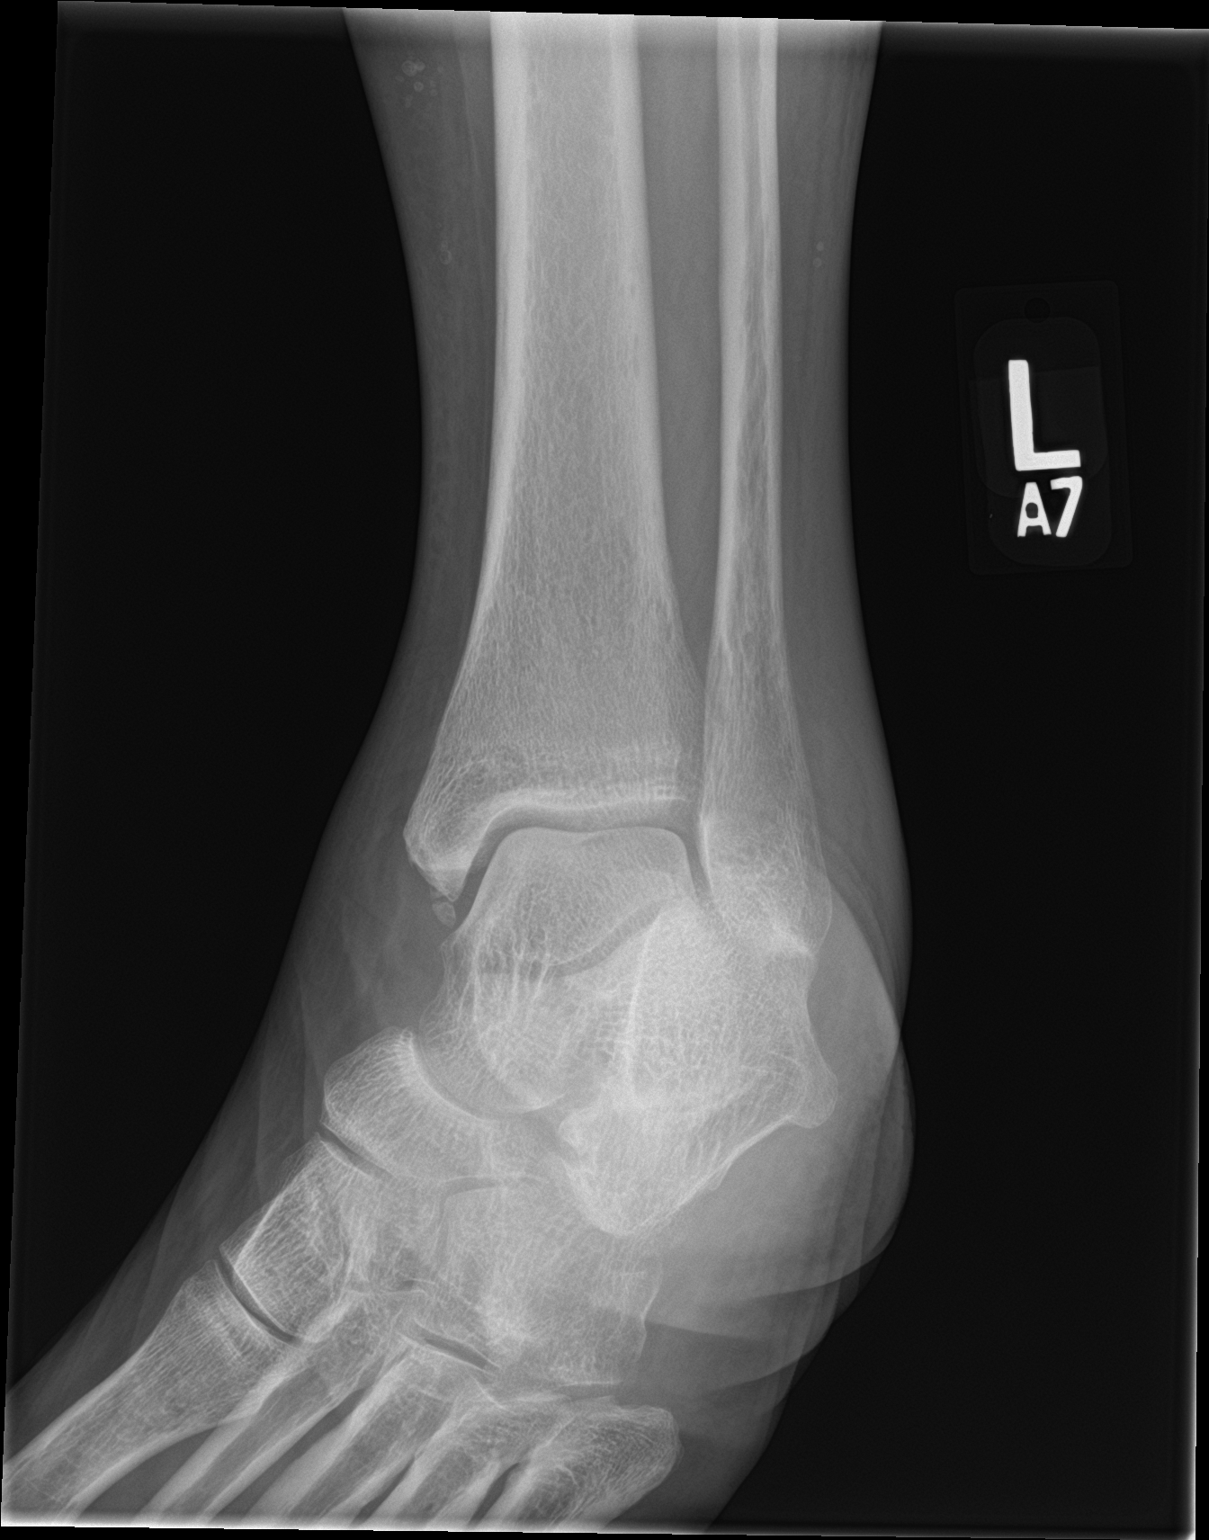

[ankle lat]
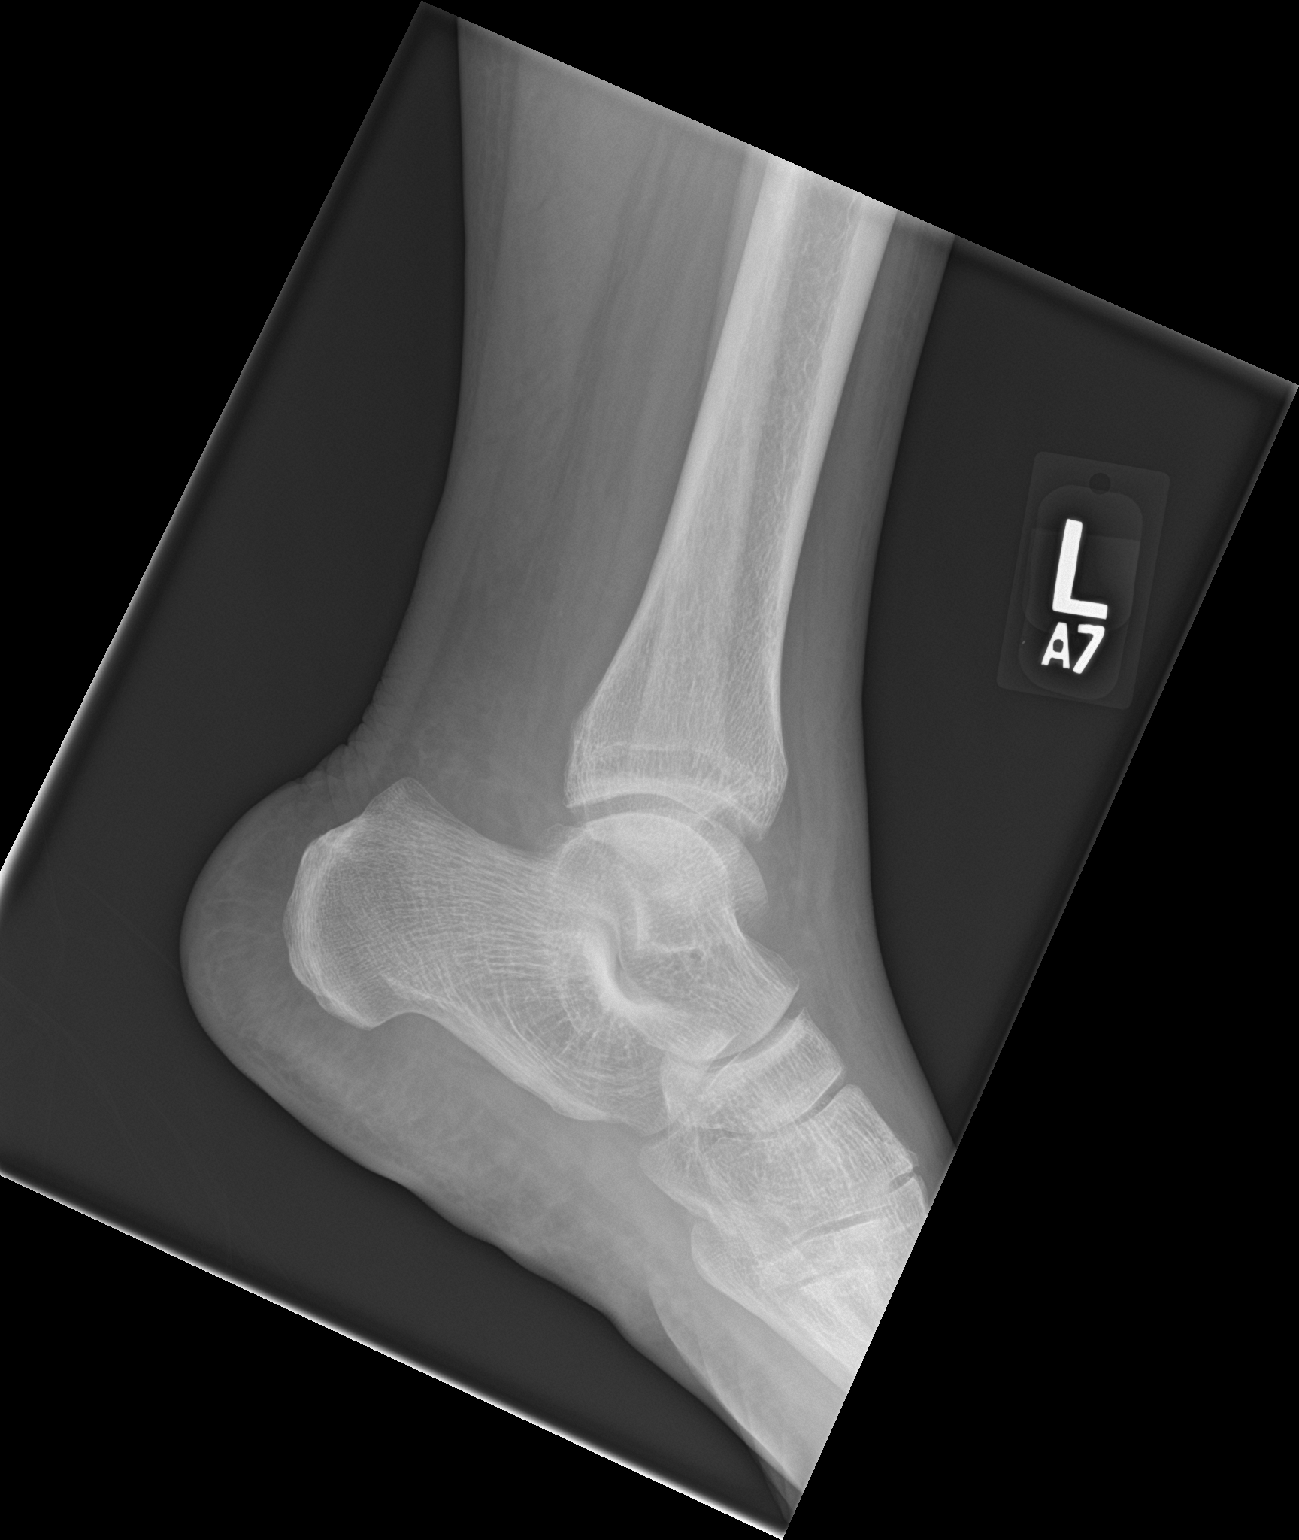

[3 of 3 positions shown; findings below may reference images not displayed]

IMPRESSION: 1. Diffuse soft tissue swelling.

2.  No definite evidence for acute fracture identified.
FINDINGS: There is no evidence of fracture, dislocation, or joint effusion.
There is no evidence of arthropathy or other focal bone abnormality.
Diffuse soft tissue swelling is identified. There is a well
corticated os ossific density adjacent to the medial malleolus seen
on the oblique radiographs. This may represent sequelae of prior
trauma or an ossicle.
IMPRESSION: 1. Diffuse soft tissue swelling.
2. Definite evidence for acute fracture identified.

## 2020-02-05 ENCOUNTER — Other Ambulatory Visit: Payer: Medicare Other

## 2020-02-05 DIAGNOSIS — Z20822 Contact with and (suspected) exposure to covid-19: Secondary | ICD-10-CM

## 2020-02-08 LAB — NOVEL CORONAVIRUS, NAA: SARS-CoV-2, NAA: NOT DETECTED
